# Patient Record
Sex: Female | Born: 1945 | Race: White | Hispanic: Refuse to answer | Smoking: Never smoker
Health system: Southern US, Community
[De-identification: ages and names within clinical notes are randomized; demographics above are authoritative.]

## PROBLEM LIST (undated history)

## (undated) DIAGNOSIS — B019 Varicella without complication: Secondary | ICD-10-CM

## (undated) DIAGNOSIS — F419 Anxiety disorder, unspecified: Secondary | ICD-10-CM

## (undated) DIAGNOSIS — C3490 Malignant neoplasm of unspecified part of unspecified bronchus or lung: Secondary | ICD-10-CM

## (undated) DIAGNOSIS — M810 Age-related osteoporosis without current pathological fracture: Secondary | ICD-10-CM

## (undated) DIAGNOSIS — E559 Vitamin D deficiency, unspecified: Secondary | ICD-10-CM

## (undated) HISTORY — PX: WISDOM TOOTH EXTRACTION: SHX21

## (undated) HISTORY — PX: EYE SURGERY: SHX253

## (undated) HISTORY — PX: BREAST EXCISIONAL BIOPSY: SUR124

## (undated) HISTORY — DX: Age-related osteoporosis without current pathological fracture: M81.0

## (undated) HISTORY — PX: COLONOSCOPY: SHX174

## (undated) HISTORY — DX: Varicella without complication: B01.9

## (undated) HISTORY — DX: Malignant neoplasm of unspecified part of unspecified bronchus or lung: C34.90

## (undated) HISTORY — PX: BREAST BIOPSY: SHX20

## (undated) HISTORY — DX: Vitamin D deficiency, unspecified: E55.9

## (undated) HISTORY — PX: TUBAL LIGATION: SHX77

---

## 2001-01-29 ENCOUNTER — Encounter: Payer: Self-pay | Admitting: Family Medicine

## 2001-01-29 ENCOUNTER — Encounter: Admission: RE | Admit: 2001-01-29 | Discharge: 2001-01-29 | Payer: Self-pay | Admitting: Family Medicine

## 2003-06-17 ENCOUNTER — Encounter: Admission: RE | Admit: 2003-06-17 | Discharge: 2003-06-17 | Payer: Self-pay | Admitting: Family Medicine

## 2003-06-17 ENCOUNTER — Encounter: Payer: Self-pay | Admitting: Family Medicine

## 2004-12-28 ENCOUNTER — Encounter (INDEPENDENT_AMBULATORY_CARE_PROVIDER_SITE_OTHER): Payer: Self-pay | Admitting: Specialist

## 2004-12-28 ENCOUNTER — Encounter: Admission: RE | Admit: 2004-12-28 | Discharge: 2004-12-28 | Payer: Self-pay | Admitting: General Surgery

## 2005-02-16 ENCOUNTER — Encounter: Admission: RE | Admit: 2005-02-16 | Discharge: 2005-02-16 | Payer: Self-pay | Admitting: General Surgery

## 2005-02-17 ENCOUNTER — Ambulatory Visit (HOSPITAL_COMMUNITY): Admission: RE | Admit: 2005-02-17 | Discharge: 2005-02-17 | Payer: Self-pay | Admitting: General Surgery

## 2005-02-17 ENCOUNTER — Encounter: Admission: RE | Admit: 2005-02-17 | Discharge: 2005-02-17 | Payer: Self-pay | Admitting: General Surgery

## 2005-02-17 ENCOUNTER — Ambulatory Visit (HOSPITAL_BASED_OUTPATIENT_CLINIC_OR_DEPARTMENT_OTHER): Admission: RE | Admit: 2005-02-17 | Discharge: 2005-02-17 | Payer: Self-pay | Admitting: General Surgery

## 2005-02-17 ENCOUNTER — Encounter (INDEPENDENT_AMBULATORY_CARE_PROVIDER_SITE_OTHER): Payer: Self-pay | Admitting: *Deleted

## 2006-01-16 ENCOUNTER — Encounter: Admission: RE | Admit: 2006-01-16 | Discharge: 2006-01-16 | Payer: Self-pay | Admitting: Obstetrics and Gynecology

## 2007-01-22 ENCOUNTER — Encounter: Admission: RE | Admit: 2007-01-22 | Discharge: 2007-01-22 | Payer: Self-pay | Admitting: Obstetrics and Gynecology

## 2007-03-13 ENCOUNTER — Encounter: Admission: RE | Admit: 2007-03-13 | Discharge: 2007-03-13 | Payer: Self-pay | Admitting: Family Medicine

## 2008-02-27 ENCOUNTER — Encounter: Admission: RE | Admit: 2008-02-27 | Discharge: 2008-02-27 | Payer: Self-pay | Admitting: Obstetrics and Gynecology

## 2009-06-04 ENCOUNTER — Encounter: Admission: RE | Admit: 2009-06-04 | Discharge: 2009-06-04 | Payer: Self-pay | Admitting: Obstetrics and Gynecology

## 2010-11-17 ENCOUNTER — Other Ambulatory Visit: Payer: Self-pay | Admitting: Obstetrics and Gynecology

## 2010-11-17 DIAGNOSIS — Z1231 Encounter for screening mammogram for malignant neoplasm of breast: Secondary | ICD-10-CM

## 2010-11-24 ENCOUNTER — Ambulatory Visit
Admission: RE | Admit: 2010-11-24 | Discharge: 2010-11-24 | Disposition: A | Discharge status: Home / Self Care | Payer: BC Managed Care – PPO | Source: Ambulatory Visit | Attending: Obstetrics and Gynecology | Admitting: Obstetrics and Gynecology

## 2010-11-24 DIAGNOSIS — Z1231 Encounter for screening mammogram for malignant neoplasm of breast: Secondary | ICD-10-CM

## 2011-01-28 NOTE — Op Note (Signed)
NAMELEIANN, SPORER                ACCOUNT NO.:  [id number]   MEDICAL RECORD NO.:  [id number]          PATIENT TYPE:  AMB   LOCATION:  DSC                          FACILITY:  [facility]   PHYSICIAN:  Ollen Gross. Vernell Morgans, M.D. 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   PROCEDURES:  Left breast needle localized lumpectomy.   SURGEON:  Ollen Gross. Carolynne Edouard, M.D.   ANESTHESIA:  General via LMA.   PROCEDURE:  After informed consent was obtained, the patient was brought to  the operating placed in supine position on the operating room table. After  adequate induction of general anesthesia, the patient had previously  undergone this morning a needle localization procedure.  The left breast was  prepped with Betadine and draped in usual sterile manner, taking care to  avoid any movement of the wire.  Next an elliptical incision was made around  the exit site of the wire and medial on the breast.  This incision was  carried down through the skin and subcutaneous tissue.  The course of the  wire was noted on x-ray and dissection was carried down towards the path  that the wire was taking.  A round concentric mass of breast tissue was  taken out around the path and tip of the wire. This was done by sharp  dissection with electrocautery. This was done so that the tip of the wire  was never visualized and the tissue around the wire was removed. The  specimen once removed was then oriented with a margin map and sent to  radiology where specimen x-ray was obtained that showed that all the  calcifications were within the specimen.  The specimen was then sent fresh  to pathology for further evaluation. The wound was then irrigated  copious  amounts of saline. Hemostasis was achieved using Bovie electrocautery. The  skin was then reapproximated with a running 4-0 Monocryl subcuticular  stitch. The wound was also infiltrated 4% Marcaine prior to finishing and  benzoin, Steri-Strips and sterile dressings were then applied. The patient  tolerated well.  At the end of the case, all needle, sponge and instrument  counts were correct. The patient was awakened and taken recovery in stable  condition.       PST/MEDQ  D:  02/17/2005  T:  02/17/2005  Job:  [id number]

## 2012-09-24 ENCOUNTER — Other Ambulatory Visit: Payer: Self-pay | Admitting: Family Medicine

## 2012-09-24 ENCOUNTER — Ambulatory Visit
Admission: RE | Admit: 2012-09-24 | Discharge: 2012-09-24 | Disposition: A | Discharge status: Home / Self Care | Payer: BC Managed Care – PPO | Source: Ambulatory Visit | Attending: Family Medicine | Admitting: Family Medicine

## 2012-09-24 DIAGNOSIS — J069 Acute upper respiratory infection, unspecified: Secondary | ICD-10-CM

## 2012-09-25 ENCOUNTER — Other Ambulatory Visit: Payer: Self-pay | Admitting: Unknown Physician Specialty

## 2012-09-25 ENCOUNTER — Ambulatory Visit
Admission: RE | Admit: 2012-09-25 | Discharge: 2012-09-25 | Disposition: A | Discharge status: Home / Self Care | Payer: BC Managed Care – PPO | Source: Ambulatory Visit | Attending: Unknown Physician Specialty | Admitting: Unknown Physician Specialty

## 2012-09-25 DIAGNOSIS — R9389 Abnormal findings on diagnostic imaging of other specified body structures: Secondary | ICD-10-CM

## 2013-01-08 ENCOUNTER — Other Ambulatory Visit: Payer: Self-pay | Admitting: Unknown Physician Specialty

## 2013-01-08 ENCOUNTER — Ambulatory Visit
Admission: RE | Admit: 2013-01-08 | Discharge: 2013-01-08 | Disposition: A | Discharge status: Home / Self Care | Payer: BC Managed Care – PPO | Source: Ambulatory Visit | Attending: Unknown Physician Specialty | Admitting: Unknown Physician Specialty

## 2013-01-08 DIAGNOSIS — R1011 Right upper quadrant pain: Secondary | ICD-10-CM

## 2013-09-12 DIAGNOSIS — T8859XA Other complications of anesthesia, initial encounter: Secondary | ICD-10-CM

## 2013-09-12 HISTORY — DX: Other complications of anesthesia, initial encounter: T88.59XA

## 2013-10-03 ENCOUNTER — Encounter: Payer: Self-pay | Admitting: Internal Medicine

## 2013-11-08 ENCOUNTER — Ambulatory Visit (AMBULATORY_SURGERY_CENTER): Payer: Self-pay

## 2013-11-08 ENCOUNTER — Encounter: Payer: Self-pay | Admitting: Internal Medicine

## 2013-11-08 VITALS — Ht 66.0 in | Wt 155.0 lb

## 2013-11-08 DIAGNOSIS — Z1211 Encounter for screening for malignant neoplasm of colon: Secondary | ICD-10-CM

## 2013-11-08 MED ORDER — SUPREP BOWEL PREP KIT 17.5-3.13-1.6 GM/177ML PO SOLN: 1.0000 | Freq: Once | ORAL | Status: DC

## 2013-11-22 ENCOUNTER — Encounter: Payer: Self-pay | Admitting: Internal Medicine

## 2013-11-22 ENCOUNTER — Ambulatory Visit (AMBULATORY_SURGERY_CENTER): Payer: BC Managed Care – PPO | Admitting: Internal Medicine

## 2013-11-22 VITALS — BP 124/77 | HR 59 | Temp 97.8°F | Resp 17 | Ht 66.0 in | Wt 155.0 lb

## 2013-11-22 DIAGNOSIS — Z1211 Encounter for screening for malignant neoplasm of colon: Secondary | ICD-10-CM

## 2013-11-22 DIAGNOSIS — K644 Residual hemorrhoidal skin tags: Secondary | ICD-10-CM

## 2013-11-22 MED ORDER — SODIUM CHLORIDE 0.9 % IV SOLN: 500.0000 mL | INTRAVENOUS | Status: DC

## 2013-11-22 NOTE — Op Note (Signed)
[facility]  [address] [place], [address]   COLONOSCOPY PROCEDURE REPORT  PATIENT: Taylor Bailey, Taylor Bailey  MR#: [id number] BIRTHDATE: 10-12-1945 , 51  yrs. old GENDER: Female ENDOSCOPIST: Gatha Mayer, MD, [facility] PROCEDURE DATE:  11/22/2013 PROCEDURE:   Colonoscopy, screening First Screening Colonoscopy - Avg.  risk and is 50 yrs.  old or older - No.  Prior Negative Screening - Now for repeat screening. 10 or more years since last screening  History of Adenoma - Now for follow-up colonoscopy & has been > or = to 3 yrs.  N/A  Polyps Removed Today? No.  Recommend repeat exam, <10 yrs? No. ASA CLASS:   Class I INDICATIONS:average risk screening and Last colonoscopy performed 2004. MEDICATIONS: propofol (Diprivan) 200mg  IV, MAC sedation, administered by CRNA, and These medications were titrated to patient response per physician's verbal order  DESCRIPTION OF PROCEDURE:   After the risks benefits and alternatives of the procedure were thoroughly explained, informed consent was obtained.  A digital rectal exam revealed no abnormalities of the rectum.   The LB [id number]  endoscope was introduced through the anus and advanced to the cecum, which was identified by both the appendix and ileocecal valve. No adverse events experienced.   The quality of the prep was excellent using Suprep  The instrument was then slowly withdrawn as the colon was fully examined.      COLON FINDINGS: A normal appearing cecum, ileocecal valve, and appendiceal orifice were identified.  The ascending, hepatic flexure, transverse, splenic flexure, descending, sigmoid colon and rectum appeared unremarkable.  No polyps or cancers were seen. Retroflexed views revealed external hemorrhoids. The time to cecum=6 minutes 36 seconds.  Withdrawal time=8 minutes 10 seconds. The scope was withdrawn and the procedure completed. COMPLICATIONS: There were no complications.  ENDOSCOPIC  IMPRESSION: Normal colon - small external hemorrhoids - excellent prep - second screening exam  RECOMMENDATIONS: Repeat Colonscopy in 10 years - 2025   eSigned:  Gatha Mayer, MD, [facility] 11/22/2013 9:09 AM   cc: The Patient

## 2013-11-22 NOTE — Progress Notes (Signed)
  [facility] Post-op Note  Patient: Taylor Bailey  Procedure(s) Performed: colonoscopy  Patient Location: [facility] - Recovery Area  Anesthesia Type: Deep Sedation/Propofol  Level of Consciousness: awake, oriented and patient cooperative  Airway and Oxygen Therapy: Patient Spontanous Breathing  Post-op Pain: none  Post-op Assessment:  Post-op Vital signs reviewed, Patient's Cardiovascular Status Stable, Respiratory Function Stable, Patent Airway, No signs of Nausea or vomiting and Pain level controlled  Post-op Vital Signs: Reviewed and stable  Complications: No apparent anesthesia complications  Finleigh Cheong E 9:09 AM

## 2013-11-22 NOTE — Patient Instructions (Addendum)
No polyps or cancer seen! Normal except for small external hemorrhoids (inside the anal canal).  Next routine colonoscopy in 10 years - 2025  I appreciate the opportunity to care for you. Gatha Mayer, MD, FACG  YOU HAD AN ENDOSCOPIC PROCEDURE TODAY AT [facility]: Refer to the procedure report that was given to you for any specific questions about what was found during the examination.  If the procedure report does not answer your questions, please call your gastroenterologist to clarify.  If you requested that your care partner not be given the details of your procedure findings, then the procedure report has been included in a sealed envelope for you to review at your convenience later.  YOU SHOULD EXPECT: Some feelings of bloating in the abdomen. Passage of more gas than usual.  Walking can help get rid of the air that was put into your GI tract during the procedure and reduce the bloating. If you had a lower endoscopy (such as a colonoscopy or flexible sigmoidoscopy) you may notice spotting of blood in your stool or on the toilet paper. If you underwent a bowel prep for your procedure, then you may not have a normal bowel movement for a few days.  DIET: Your first meal following the procedure should be a light meal and then it is ok to progress to your normal diet.  A half-sandwich or bowl of soup is an example of a good first meal.  Heavy or fried foods are harder to digest and may make you feel nauseous or bloated.  Likewise meals heavy in dairy and vegetables can cause extra gas to form and this can also increase the bloating.  Drink plenty of fluids but you should avoid alcoholic beverages for 24 hours.  ACTIVITY: Your care partner should take you home directly after the procedure.  You should plan to take it easy, moving slowly for the rest of the day.  You can resume normal activity the day after the procedure however you should NOT DRIVE or use heavy machinery for 24  hours (because of the sedation medicines used during the test).    SYMPTOMS TO REPORT IMMEDIATELY: A gastroenterologist can be reached at any hour.  During normal business hours, 8:30 AM to 5:00 PM Monday through Friday, call [phone].  After hours and on weekends, please call the GI answering service at [phone] who will take a message and have the physician on call contact you.   Following lower endoscopy (colonoscopy or flexible sigmoidoscopy):  Excessive amounts of blood in the stool  Significant tenderness or worsening of abdominal pains  Swelling of the abdomen that is new, acute  Fever of 100F or higher  FOLLOW UP: If any biopsies were taken you will be contacted by phone or by letter within the next 1-3 weeks.  Call your gastroenterologist if you have not heard about the biopsies in 3 weeks.  Our staff will call the home number listed on your records the next business day following your procedure to check on you and address any questions or concerns that you may have at that time regarding the information given to you following your procedure. This is a courtesy call and so if there is no answer at the home number and we have not heard from you through the emergency physician on call, we will assume that you have returned to your regular daily activities without incident.  SIGNATURES/CONFIDENTIALITY: You and/or your care partner have signed paperwork which  will be entered into your electronic medical record.  These signatures attest to the fact that that the information above on your After Visit Summary has been reviewed and is understood.  Full responsibility of the confidentiality of this discharge information lies with you and/or your care-partner.

## 2013-11-25 ENCOUNTER — Telehealth: Payer: Self-pay | Admitting: *Deleted

## 2013-11-25 NOTE — Telephone Encounter (Signed)
  Follow up Call-  Call back number 11/22/2013  Post procedure Call Back phone  # [phone]  Permission to leave phone message Yes     Patient questions:  Left message to call us if necessary.

## 2014-07-08 ENCOUNTER — Other Ambulatory Visit: Payer: Self-pay | Admitting: Obstetrics and Gynecology

## 2014-07-10 LAB — CYTOLOGY - PAP

## 2016-09-12 HISTORY — PX: CATARACT EXTRACTION, BILATERAL: SHX1313

## 2017-04-11 ENCOUNTER — Encounter: Payer: Self-pay | Admitting: Obstetrics & Gynecology

## 2017-04-11 ENCOUNTER — Ambulatory Visit (INDEPENDENT_AMBULATORY_CARE_PROVIDER_SITE_OTHER): Payer: BLUE CROSS/BLUE SHIELD | Admitting: Obstetrics & Gynecology

## 2017-04-11 ENCOUNTER — Ambulatory Visit (INDEPENDENT_AMBULATORY_CARE_PROVIDER_SITE_OTHER): Payer: Self-pay | Admitting: Obstetrics & Gynecology

## 2017-04-11 ENCOUNTER — Telehealth: Payer: Self-pay | Admitting: *Deleted

## 2017-04-11 VITALS — BP 142/90 | Ht 65.0 in | Wt 168.0 lb

## 2017-04-11 DIAGNOSIS — L292 Pruritus vulvae: Secondary | ICD-10-CM

## 2017-04-11 DIAGNOSIS — N760 Acute vaginitis: Secondary | ICD-10-CM

## 2017-04-11 DIAGNOSIS — K644 Residual hemorrhoidal skin tags: Secondary | ICD-10-CM

## 2017-04-11 DIAGNOSIS — B9689 Other specified bacterial agents as the cause of diseases classified elsewhere: Secondary | ICD-10-CM

## 2017-04-11 LAB — WET PREP FOR TRICH, YEAST, CLUE
Trich, Wet Prep: NONE SEEN
Yeast Wet Prep HPF POC: NONE SEEN

## 2017-04-11 MED ORDER — LIDOCAINE-HYDROCORTISONE ACE 3-1 % RE KIT: 1.0000 "application " | PACK | Freq: Two times a day (BID) | RECTAL | 2 refills | Status: DC

## 2017-04-11 MED ORDER — METRONIDAZOLE 0.75 % VA GEL: 1.0000 | Freq: Every day | VAGINAL | 0 refills | Status: AC

## 2017-04-11 NOTE — Telephone Encounter (Signed)
Pt was prescribed lidocaine HC 3-1% cream kit medication is not covered with insurance and over $400 with discount card per pharmacy. Can pt have another Rx?

## 2017-04-11 NOTE — Patient Instructions (Signed)
1. Vulvovaginal itching Possible Yeast Vaginitis initially, but treated with Monistat and no Yeast on Wet prep currently.  2. BV (bacterial vaginosis) BV positive on Wet prep.  Metronidazole Gel sent to pharmacy.  Usage reviewed.  Fluconazole 1 tab PO after Metronidazole as needed for prevention of Yeast.  3. Inflamed external hemorrhoid Recommend pushing hemorrhoid back in as much as possible.  Anamantle generic prescribed.  F/U Annual/Gyn exam at 1 year of last one.  Merilynn, it was a pleasure to meet you today!

## 2017-04-11 NOTE — Progress Notes (Signed)
    Taylor Bailey [birth year] [id number]        71 y.o.  G2P2 Widowed.  Son has 2 children.  Daughter single.  RP:  Vulvovaginal itching/burning post Cipro   HPI:  Used Monistat x 3 days, last dose last night, after finishing Cipro because of vulvar and vaginal burning with some itching.  No vaginal d/c seen.  No bleeding.  Burning especially at posterior aspect of vulva/perianally currently.  Has hemorrhoids since had her children.  No pelvic pain.  No UTI Sx currently.  BMs wnl.  Past medical history,surgical history, problem list, medications, allergies, family history and social history were all reviewed and documented in the EPIC chart.  Directed ROS with pertinent positives and negatives documented in the history of present illness/assessment and plan.  Exam:  Vitals:   04/11/17 1136  BP: (!) 142/90  Weight: 168 lb (76.2 kg)  Height: 5\' 5"  (1.651 m)   General appearance:  Normal  Gyn exam:  Vulva normal, no erythema, no lesion.                    Speculum:  Mild white d/c which could be Monistat cream.  Wet prep done.  Cervix/Vagina normal.                    Anal area with External very inflamed hemorrhoids.  Not thrombosed, not bleeding.  Hemorrhoids pushed back in.   Assessment/Plan:  71 y.o. G2P2   1. Vulvovaginal itching Possible Yeast Vaginitis initially, but treated with Monistat and no Yeast on Wet prep currently.  2. BV (bacterial vaginosis) BV positive on Wet prep.  Metronidazole Gel sent to pharmacy.  Usage reviewed.  Fluconazole 1 tab PO after Metronidazole as needed for prevention of Yeast.  3. Inflamed external hemorrhoid Recommend pushing hemorrhoid back in as much as possible.  Anamantle generic prescribed.  F/U Annual/Gyn exam at 1 year of last one.  Counseling on above issues >50% x 30 minutes.     Princess Bruins MD, 11:55 AM 04/11/2017

## 2017-04-13 NOTE — Telephone Encounter (Signed)
Prescription was for Anamantle generic for inflamed Hemorrhoids.  She can have any Hemorrhoid cream for Hemorrhoids, including simply Preparation H over-the-counter.

## 2017-04-13 NOTE — Telephone Encounter (Signed)
Pt informed, pt will use preparation H OTC states she is feeling much better since office visit.

## 2017-07-19 ENCOUNTER — Encounter: Payer: Self-pay | Admitting: Obstetrics & Gynecology

## 2017-07-19 ENCOUNTER — Ambulatory Visit (INDEPENDENT_AMBULATORY_CARE_PROVIDER_SITE_OTHER): Payer: BLUE CROSS/BLUE SHIELD | Admitting: Obstetrics & Gynecology

## 2017-07-19 VITALS — BP 128/84 | Ht 65.0 in | Wt 153.0 lb

## 2017-07-19 DIAGNOSIS — Z01419 Encounter for gynecological examination (general) (routine) without abnormal findings: Secondary | ICD-10-CM | POA: Diagnosis not present

## 2017-07-19 DIAGNOSIS — Z780 Asymptomatic menopausal state: Secondary | ICD-10-CM

## 2017-07-19 NOTE — Progress Notes (Signed)
Taylor Bailey 11-17-1945 [id number]   History:    71 y.o. G2P2  Widowed  RP:  Established patient presenting for annual gyn exam   HPI:  No problem with vulva since treatment for BV/Yeasts in 03/2017.  Menopause well-tolerated without hormone replacement therapy.  No postmenopausal bleeding.  No pelvic pain.  Breasts normal.  Urine and bowel movements normal.  Body mass index 25.46.  Past medical history,surgical history, family history and social history were all reviewed and documented in the EPIC chart.  Gynecologic History No LMP recorded. Patient is postmenopausal. Contraception: post menopausal status Last Pap: 2015. Results were: normal Last mammogram: 07/2016. Results were: normal Colono 2015 Dexa 2008  Obstetric History OB History  Gravida Para Term Preterm AB Living  2 2       2   SAB TAB Ectopic Multiple Live Births               # Outcome Date GA Lbr Len/2nd Weight Sex Delivery Anes PTL Lv  2 Para           1 Para                ROS: A ROS was performed and pertinent positives and negatives are included in the history.  GENERAL: No fevers or chills. HEENT: No change in vision, no earache, sore throat or sinus congestion. NECK: No pain or stiffness. CARDIOVASCULAR: No chest pain or pressure. No palpitations. PULMONARY: No shortness of breath, cough or wheeze. GASTROINTESTINAL: No abdominal pain, nausea, vomiting or diarrhea, melena or bright red blood per rectum. GENITOURINARY: No urinary frequency, urgency, hesitancy or dysuria. MUSCULOSKELETAL: No joint or muscle pain, no back pain, no recent trauma. DERMATOLOGIC: No rash, no itching, no lesions. ENDOCRINE: No polyuria, polydipsia, no heat or cold intolerance. No recent change in weight. HEMATOLOGICAL: No anemia or easy bruising or bleeding. NEUROLOGIC: No headache, seizures, numbness, tingling or weakness. PSYCHIATRIC: No depression, no loss of interest in normal activity or change in sleep pattern.      Exam:   BP 128/84   Ht 5\' 5"  (1.651 m)   Wt 153 lb (69.4 kg)   BMI 25.46 kg/m   Body mass index is 25.46 kg/m.  General appearance : Well developed well nourished female. No acute distress HEENT: Eyes: no retinal hemorrhage or exudates,  Neck supple, trachea midline, no carotid bruits, no thyroidmegaly Lungs: Clear to auscultation, no rhonchi or wheezes, or rib retractions  Heart: Regular rate and rhythm, no murmurs or gallops Breast:Examined in sitting and supine position were symmetrical in appearance, no palpable masses or tenderness,  no skin retraction, no nipple inversion, no nipple discharge, no skin discoloration, no axillary or supraclavicular lymphadenopathy Abdomen: no palpable masses or tenderness, no rebound or guarding Extremities: no edema or skin discoloration or tenderness  Pelvic: Vulva normal  Bartholin, Urethra, Skene Glands: Within normal limits             Vagina: No gross lesions or discharge  Cervix: No gross lesions or discharge.  Pap reflex done.  Uterus  AV, normal size, shape and consistency, non-tender and mobile  Adnexa  Without masses or tenderness  Anus and perineum  normal    Assessment/Plan:  71 y.o. female for annual exam   1. Encounter for routine gynecological examination with Papanicolaou smear of cervix Normal gynecologic exam.  Pap reflex done.  Breasts normal.  Will schedule screening mammogram.  Up-to-date with colonoscopy.  Fasting labs with family doctor.  2.  Menopause present Menopause well-tolerated without hormone replacement therapy.  No postmenopausal bleeding.  Vitamin D supplements.  Calcium rich diet.  Weightbearing physical activity recommended.  Will follow up with a bone density. - DG Bone Density; Future   Princess Bruins MD, 3:07 PM 07/19/2017

## 2017-07-20 ENCOUNTER — Other Ambulatory Visit: Payer: Self-pay | Admitting: Obstetrics & Gynecology

## 2017-07-20 DIAGNOSIS — Z1231 Encounter for screening mammogram for malignant neoplasm of breast: Secondary | ICD-10-CM

## 2017-07-21 LAB — PAP IG W/ RFLX HPV ASCU

## 2017-07-23 ENCOUNTER — Encounter: Payer: Self-pay | Admitting: Obstetrics & Gynecology

## 2017-07-23 NOTE — Patient Instructions (Signed)
1. Encounter for routine gynecological examination with Papanicolaou smear of cervix   2. Menopause present Menopause well-tolerated without hormone replacement therapy.  No postmenopausal bleeding.  Vitamin D supplements.  Calcium rich diet.  Weightbearing physical activity recommended.  Will follow up with a bone density. - [facility] Bone Density; Future  Taylor Bailey, it was a pleasure seeing you today.  I will inform you of your results as soon as available.   Health Maintenance for Postmenopausal Women Menopause is a normal process in which your reproductive ability comes to an end. This process happens gradually over a span of months to years, usually between the ages of 66 and 42. Menopause is complete when you have missed 12 consecutive menstrual periods. It is important to talk with your health care provider about some of the most common conditions that affect postmenopausal women, such as heart disease, cancer, and bone loss (osteoporosis). Adopting a healthy lifestyle and getting preventive care can help to promote your health and wellness. Those actions can also lower your chances of developing some of these common conditions. What should I know about menopause? During menopause, you may experience a number of symptoms, such as:  Moderate-to-severe hot flashes.  Night sweats.  Decrease in sex drive.  Mood swings.  Headaches.  Tiredness.  Irritability.  Memory problems.  Insomnia.  Choosing to treat or not to treat menopausal changes is an individual decision that you make with your health care provider. What should I know about hormone replacement therapy and supplements? Hormone therapy products are effective for treating symptoms that are associated with menopause, such as hot flashes and night sweats. Hormone replacement carries certain risks, especially as you become older. If you are thinking about using estrogen or estrogen with progestin treatments, discuss the benefits and  risks with your health care provider. What should I know about heart disease and stroke? Heart disease, heart attack, and stroke become more likely as you age. This may be due, in part, to the hormonal changes that your body experiences during menopause. These can affect how your body processes dietary fats, triglycerides, and cholesterol. Heart attack and stroke are both medical emergencies. There are many things that you can do to help prevent heart disease and stroke:  Have your blood pressure checked at least every 1-2 years. High blood pressure causes heart disease and increases the risk of stroke.  If you are 92-9 years old, ask your health care provider if you should take aspirin to prevent a heart attack or a stroke.  Do not use any tobacco products, including cigarettes, chewing tobacco, or electronic cigarettes. If you need help quitting, ask your health care provider.  It is important to eat a healthy diet and maintain a healthy weight. ? Be sure to include plenty of vegetables, fruits, low-fat dairy products, and lean protein. ? Avoid eating foods that are high in solid fats, added sugars, or salt (sodium).  Get regular exercise. This is one of the most important things that you can do for your health. ? Try to exercise for at least 150 minutes each week. The type of exercise that you do should increase your heart rate and make you sweat. This is known as moderate-intensity exercise. ? Try to do strengthening exercises at least twice each week. Do these in addition to the moderate-intensity exercise.  Know your numbers.Ask your health care provider to check your cholesterol and your blood glucose. Continue to have your blood tested as directed by your health care provider.  What should I know about cancer screening? There are several types of cancer. Take the following steps to reduce your risk and to catch any cancer development as early as possible. Breast Cancer  Practice  breast self-awareness. ? This means understanding how your breasts normally appear and feel. ? It also means doing regular breast self-exams. Let your health care provider know about any changes, no matter how small.  If you are 57 or older, have a clinician do a breast exam (clinical breast exam or CBE) every year. Depending on your age, family history, and medical history, it may be recommended that you also have a yearly breast X-ray (mammogram).  If you have a family history of breast cancer, talk with your health care provider about genetic screening.  If you are at high risk for breast cancer, talk with your health care provider about having an MRI and a mammogram every year.  Breast cancer (BRCA) gene test is recommended for women who have family members with BRCA-related cancers. Results of the assessment will determine the need for genetic counseling and BRCA1 and for BRCA2 testing. BRCA-related cancers include these types: ? Breast. This occurs in males or females. ? Ovarian. ? Tubal. This may also be called fallopian tube cancer. ? Cancer of the abdominal or pelvic lining (peritoneal cancer). ? Prostate. ? Pancreatic.  Cervical, Uterine, and Ovarian Cancer Your health care provider may recommend that you be screened regularly for cancer of the pelvic organs. These include your ovaries, uterus, and vagina. This screening involves a pelvic exam, which includes checking for microscopic changes to the surface of your cervix (Pap test).  For women ages 21-65, health care providers may recommend a pelvic exam and a Pap test every three years. For women ages 54-65, they may recommend the Pap test and pelvic exam, combined with testing for human papilloma virus (HPV), every five years. Some types of HPV increase your risk of cervical cancer. Testing for HPV may also be done on women of any age who have unclear Pap test results.  Other health care providers may not recommend any screening  for nonpregnant women who are considered low risk for pelvic cancer and have no symptoms. Ask your health care provider if a screening pelvic exam is right for you.  If you have had past treatment for cervical cancer or a condition that could lead to cancer, you need Pap tests and screening for cancer for at least 20 years after your treatment. If Pap tests have been discontinued for you, your risk factors (such as having a new sexual partner) need to be reassessed to determine if you should start having screenings again. Some women have medical problems that increase the chance of getting cervical cancer. In these cases, your health care provider may recommend that you have screening and Pap tests more often.  If you have a family history of uterine cancer or ovarian cancer, talk with your health care provider about genetic screening.  If you have vaginal bleeding after reaching menopause, tell your health care provider.  There are currently no reliable tests available to screen for ovarian cancer.  Lung Cancer Lung cancer screening is recommended for adults 13-76 years old who are at high risk for lung cancer because of a history of smoking. A yearly low-dose CT scan of the lungs is recommended if you:  Currently smoke.  Have a history of at least 30 pack-years of smoking and you currently smoke or have quit within the past  15 years. A pack-year is smoking an average of one pack of cigarettes per day for one year.  Yearly screening should:  Continue until it has been 15 years since you quit.  Stop if you develop a health problem that would prevent you from having lung cancer treatment.  Colorectal Cancer  This type of cancer can be detected and can often be prevented.  Routine colorectal cancer screening usually begins at age 38 and continues through age 34.  If you have risk factors for colon cancer, your health care provider may recommend that you be screened at an earlier age.  If  you have a family history of colorectal cancer, talk with your health care provider about genetic screening.  Your health care provider may also recommend using home test kits to check for hidden blood in your stool.  A small camera at the end of a tube can be used to examine your colon directly (sigmoidoscopy or colonoscopy). This is done to check for the earliest forms of colorectal cancer.  Direct examination of the colon should be repeated every 5-10 years until age 59. However, if early forms of precancerous polyps or small growths are found or if you have a family history or genetic risk for colorectal cancer, you may need to be screened more often.  Skin Cancer  Check your skin from head to toe regularly.  Monitor any moles. Be sure to tell your health care provider: ? About any new moles or changes in moles, especially if there is a change in a mole's shape or color. ? If you have a mole that is larger than the size of a pencil eraser.  If any of your family members has a history of skin cancer, especially at a young age, talk with your health care provider about genetic screening.  Always use sunscreen. Apply sunscreen liberally and repeatedly throughout the day.  Whenever you are outside, protect yourself by wearing long sleeves, pants, a wide-brimmed hat, and sunglasses.  What should I know about osteoporosis? Osteoporosis is a condition in which bone destruction happens more quickly than new bone creation. After menopause, you may be at an increased risk for osteoporosis. To help prevent osteoporosis or the bone fractures that can happen because of osteoporosis, the following is recommended:  If you are 30-34 years old, get at least 1,000 mg of calcium and at least 600 mg of vitamin D per day.  If you are older than age 2 but younger than age 13, get at least 1,200 mg of calcium and at least 600 mg of vitamin D per day.  If you are older than age 74, get at least 1,200 mg of  calcium and at least 800 mg of vitamin D per day.  Smoking and excessive alcohol intake increase the risk of osteoporosis. Eat foods that are rich in calcium and vitamin D, and do weight-bearing exercises several times each week as directed by your health care provider. What should I know about how menopause affects my mental health? Depression may occur at any age, but it is more common as you become older. Common symptoms of depression include:  Low or sad mood.  Changes in sleep patterns.  Changes in appetite or eating patterns.  Feeling an overall lack of motivation or enjoyment of activities that you previously enjoyed.  Frequent crying spells.  Talk with your health care provider if you think that you are experiencing depression. What should I know about immunizations? It is important  that you get and maintain your immunizations. These include:  Tetanus, diphtheria, and pertussis (Tdap) booster vaccine.  Influenza every year before the flu season begins.  Pneumonia vaccine.  Shingles vaccine.  Your health care provider may also recommend other immunizations. This information is not intended to replace advice given to you by your health care provider. Make sure you discuss any questions you have with your health care provider. Document Released: 10/21/2005 Document Revised: 03/18/2016 Document Reviewed: 06/02/2015 Elsevier Interactive Patient Education  2018 [employer]  

## 2017-08-15 ENCOUNTER — Ambulatory Visit
Admission: RE | Admit: 2017-08-15 | Discharge: 2017-08-15 | Disposition: A | Discharge status: Home / Self Care | Payer: BLUE CROSS/BLUE SHIELD | Source: Ambulatory Visit | Attending: Obstetrics & Gynecology | Admitting: Obstetrics & Gynecology

## 2017-08-15 DIAGNOSIS — Z1231 Encounter for screening mammogram for malignant neoplasm of breast: Secondary | ICD-10-CM

## 2017-10-10 ENCOUNTER — Other Ambulatory Visit: Payer: Self-pay | Admitting: Gynecology

## 2017-10-10 DIAGNOSIS — Z780 Asymptomatic menopausal state: Secondary | ICD-10-CM

## 2017-10-13 DIAGNOSIS — M810 Age-related osteoporosis without current pathological fracture: Secondary | ICD-10-CM

## 2017-10-13 HISTORY — DX: Age-related osteoporosis without current pathological fracture: M81.0

## 2017-10-24 ENCOUNTER — Other Ambulatory Visit: Payer: Self-pay | Admitting: Gynecology

## 2017-10-24 ENCOUNTER — Ambulatory Visit (INDEPENDENT_AMBULATORY_CARE_PROVIDER_SITE_OTHER): Payer: BLUE CROSS/BLUE SHIELD

## 2017-10-24 DIAGNOSIS — Z780 Asymptomatic menopausal state: Secondary | ICD-10-CM

## 2017-10-24 DIAGNOSIS — M810 Age-related osteoporosis without current pathological fracture: Secondary | ICD-10-CM | POA: Diagnosis not present

## 2017-10-25 ENCOUNTER — Encounter: Payer: Self-pay | Admitting: Gynecology

## 2017-10-25 ENCOUNTER — Telehealth: Payer: Self-pay | Admitting: Gynecology

## 2017-10-25 NOTE — Telephone Encounter (Signed)
Tell patient her most recent bone density shows osteoporosis.  She needs to make an appointment with Dr Dellis Filbert to discuss treatment options.  She is at an increased risk of fracture

## 2017-10-26 NOTE — Telephone Encounter (Signed)
Left message for pt to call.

## 2017-10-26 NOTE — Telephone Encounter (Signed)
Pt informed

## 2018-05-29 ENCOUNTER — Other Ambulatory Visit: Payer: Self-pay | Admitting: Obstetrics & Gynecology

## 2018-05-29 DIAGNOSIS — Z1231 Encounter for screening mammogram for malignant neoplasm of breast: Secondary | ICD-10-CM

## 2018-07-24 ENCOUNTER — Ambulatory Visit: Payer: BLUE CROSS/BLUE SHIELD | Admitting: Obstetrics & Gynecology

## 2018-07-24 ENCOUNTER — Encounter: Payer: Self-pay | Admitting: Obstetrics & Gynecology

## 2018-07-24 VITALS — BP 140/78 | Ht 65.0 in | Wt 157.0 lb

## 2018-07-24 DIAGNOSIS — M810 Age-related osteoporosis without current pathological fracture: Secondary | ICD-10-CM | POA: Diagnosis not present

## 2018-07-24 DIAGNOSIS — Z780 Asymptomatic menopausal state: Secondary | ICD-10-CM | POA: Diagnosis not present

## 2018-07-24 DIAGNOSIS — Z01419 Encounter for gynecological examination (general) (routine) without abnormal findings: Secondary | ICD-10-CM | POA: Diagnosis not present

## 2018-07-24 NOTE — Progress Notes (Signed)
Taylor Bailey [birth year] [id number]   History:    72 y.o. G2P2L2 Widowed.  Working for [employer].  RP:  Established patient presenting for annual gyn exam   HPI: Very fit and active.  Menopause, well on no HRT.  No PMB.  No pelvic pain.  Abstinent.  Urine and bowel movements normal.  Breasts normal.  Body mass index 26.13.  Fitness through regular walking and gym.  Excellent nutrition.  Health labs with family physician.  Colonoscopy normal in 2015.  Past medical history,surgical history, family history and social history were all reviewed and documented in the EPIC chart.  Gynecologic History No LMP recorded. Patient is postmenopausal. Contraception: abstinence and post menopausal status Last Pap: 07/2017. Results were: Negative Last mammogram: 08/2017. Results were: Negative.  [facility] Bone Density: 10/2017 Osteoporosis T-Score -2.9.  Decision not to start on medical treatment, but rather maximize Vit D, Ca++ 1.5 g/day and Weightbearing activities Colonoscopy: 2015 normal, 10 yr schedule  Obstetric History OB History  Gravida Para Term Preterm AB Living  2 2       2   SAB TAB Ectopic Multiple Live Births               # Outcome Date GA Lbr Len/2nd Weight Sex Delivery Anes PTL Lv  2 Para           1 Para              ROS: A ROS was performed and pertinent positives and negatives are included in the history.  GENERAL: No fevers or chills. HEENT: No change in vision, no earache, sore throat or sinus congestion. NECK: No pain or stiffness. CARDIOVASCULAR: No chest pain or pressure. No palpitations. PULMONARY: No shortness of breath, cough or wheeze. GASTROINTESTINAL: No abdominal pain, nausea, vomiting or diarrhea, melena or bright red blood per rectum. GENITOURINARY: No urinary frequency, urgency, hesitancy or dysuria. MUSCULOSKELETAL: No joint or muscle pain, no back pain, no recent trauma. DERMATOLOGIC: No rash, no itching, no lesions. ENDOCRINE: No  polyuria, polydipsia, no heat or cold intolerance. No recent change in weight. HEMATOLOGICAL: No anemia or easy bruising or bleeding. NEUROLOGIC: No headache, seizures, numbness, tingling or weakness. PSYCHIATRIC: No depression, no loss of interest in normal activity or change in sleep pattern.     Exam:   BP 140/78   Ht 5\' 5"  (1.651 m)   Wt 157 lb (71.2 kg)   BMI 26.13 kg/m   Body mass index is 26.13 kg/m.  General appearance : Well developed well nourished female. No acute distress HEENT: Eyes: no retinal hemorrhage or exudates,  Neck supple, trachea midline, no carotid bruits, no thyroidmegaly Lungs: Clear to auscultation, no rhonchi or wheezes, or rib retractions  Heart: Regular rate and rhythm, no murmurs or gallops Breast:Examined in sitting and supine position were symmetrical in appearance, no palpable masses or tenderness,  no skin retraction, no nipple inversion, no nipple discharge, no skin discoloration, no axillary or supraclavicular lymphadenopathy Abdomen: no palpable masses or tenderness, no rebound or guarding Extremities: no edema or skin discoloration or tenderness  Pelvic: Vulva: Normal             Vagina: No gross lesions or discharge  Cervix: No gross lesions or discharge  Uterus  AV, normal size, shape and consistency, non-tender and mobile  Adnexa  Without masses or tenderness  Anus: Normal   Assessment/Plan:  72 y.o. female for annual exam   1. Well female exam with  routine gynecological exam Normal gynecologic exam and menopause.  Pap test in November 2018 was negative.  No indication to repeat a Pap test this year.  Breast exam normal.  Screening mammogram is scheduled at the breast center December 2019.  Last colonoscopy was normal in 2015.  Health labs with family physician.  Body mass index 26.13.  Fit and healthy nutrition.  2. Postmenopausal Well on no hormone replacement therapy.  No postmenopausal bleeding.  3. Age-related osteoporosis without  current pathological fracture Osteoporosis on bone density February 2019.  T score -2.9.  After counseling, patient decided not to start on medical treatment for osteoporosis.  She will optimize her vitamin D supplements, calcium intake of 1.5 g a day and continue with regular weightbearing physical activities.  Her bone density will be repeated in February 2021.  Princess Bruins MD, 8:26 AM 07/24/2018

## 2018-07-29 ENCOUNTER — Encounter: Payer: Self-pay | Admitting: Obstetrics & Gynecology

## 2018-07-29 NOTE — Patient Instructions (Signed)
  1. Well female exam with routine gynecological exam Normal gynecologic exam and menopause.  Pap test in November 2018 was negative.  No indication to repeat a Pap test this year.  Breast exam normal.  Screening mammogram is scheduled at the breast center December 2019.  Last colonoscopy was normal in 2015.  Health labs with family physician.  Body mass index 26.13.  Fit and healthy nutrition.  2. Postmenopausal Well on no hormone replacement therapy.  No postmenopausal bleeding.  3. Age-related osteoporosis without current pathological fracture Osteoporosis on bone density February 2019.  T score -2.9.  After counseling, patient decided not to start on medical treatment for osteoporosis.  She will optimize her vitamin D supplements, calcium intake of 1.5 g a day and continue with regular weightbearing physical activities.  Her bone density will be repeated in February 2021.  Taylor Bailey, it was a pleasure seeing you today!

## 2018-08-16 ENCOUNTER — Ambulatory Visit
Admission: RE | Admit: 2018-08-16 | Discharge: 2018-08-16 | Disposition: A | Discharge status: Home / Self Care | Payer: BLUE CROSS/BLUE SHIELD | Source: Ambulatory Visit | Attending: Obstetrics & Gynecology | Admitting: Obstetrics & Gynecology

## 2018-08-16 DIAGNOSIS — Z1231 Encounter for screening mammogram for malignant neoplasm of breast: Secondary | ICD-10-CM

## 2018-09-12 DIAGNOSIS — C801 Malignant (primary) neoplasm, unspecified: Secondary | ICD-10-CM

## 2018-09-12 HISTORY — DX: Malignant (primary) neoplasm, unspecified: C80.1

## 2019-04-02 ENCOUNTER — Encounter: Payer: Self-pay | Admitting: Family Medicine

## 2019-04-02 ENCOUNTER — Ambulatory Visit (INDEPENDENT_AMBULATORY_CARE_PROVIDER_SITE_OTHER): Payer: BC Managed Care – PPO | Admitting: Family Medicine

## 2019-04-02 ENCOUNTER — Other Ambulatory Visit: Payer: Self-pay

## 2019-04-02 VITALS — BP 120/80 | HR 65 | Temp 98.2°F | Ht 64.5 in | Wt 160.2 lb

## 2019-04-02 DIAGNOSIS — E559 Vitamin D deficiency, unspecified: Secondary | ICD-10-CM | POA: Diagnosis not present

## 2019-04-02 DIAGNOSIS — E785 Hyperlipidemia, unspecified: Secondary | ICD-10-CM | POA: Diagnosis not present

## 2019-04-02 DIAGNOSIS — Z0000 Encounter for general adult medical examination without abnormal findings: Secondary | ICD-10-CM | POA: Diagnosis not present

## 2019-04-02 DIAGNOSIS — M810 Age-related osteoporosis without current pathological fracture: Secondary | ICD-10-CM

## 2019-04-02 NOTE — Assessment & Plan Note (Signed)
Osteoporosis and was on fosamax 6 years- unfortunately had significant bone loss in jaw (osteonecrosis). Started treatment in 43s. 10/2017  Last dexa T score -2.9 in lumbar spine with gynecology. - Wonder about possible Prolia treatment but without recent bisphosphonate treatment not sure how helpful it would be.

## 2019-04-02 NOTE — Patient Instructions (Addendum)
Health Maintenance Due  Topic Date Due  . TETANUS/TDAP Please send the updated date for this vaccine 12/01/1964  . PNA vac Low Risk Adult (1 of 2 - PCV13)- declines 12/02/2011  . Shingrix once covid in a better position  Please stop by lab before you go If you do not have mychart- we will call you about results within 5 business days of [place] receiving them.  If you have mychart- we will send your results within 3 business days of [place] receiving them.  If abnormal or we want to clarify a result, we will call or mychart you to make sure you receive the message.  If you have questions or concerns or don't hear within 5-7 days, please send [place] a message or call us.

## 2019-04-02 NOTE — Progress Notes (Signed)
Phone: [phone]   Subjective:  Patient presents today to establish care.  Prior patient of Dr. Linna Darner.  Chief Complaint  Patient presents with  . Establish Care    See problem oriented charting  The following were reviewed and entered/updated in epic: Past Medical History:  Diagnosis Date  . Chicken pox   . Osteoporosis 10/2017   T score -2.9  . Vitamin D deficiency    takes 2000 units each day   Patient Active Problem List   Diagnosis Date Noted  . Hyperlipidemia, unspecified 04/02/2019  . Vitamin D deficiency   . Osteoporosis 10/2017   Past Surgical History:  Procedure Laterality Date  . BREAST BIOPSY Left    BENIGN  . BREAST EXCISIONAL BIOPSY Left   . CATARACT EXTRACTION, BILATERAL  2018  . COLONOSCOPY    . WISDOM TOOTH EXTRACTION      Family History  Problem Relation Age of Onset  . Breast cancer Mother        lived to 21. relatives have lived in 71s and 66s  . Diabetes Maternal Grandmother   . Other Father        black lung- lived to 33  . Healthy Sister   . Colon cancer Neg Hx   . Pancreatic cancer Neg Hx   . Stomach cancer Neg Hx     Medications- reviewed and updated Current Outpatient Medications  Medication Sig Dispense Refill  . amoxicillin-clavulanate (AUGMENTIN) 875-125 MG tablet Take 1 tablet by mouth 2 (two) times daily.    . cholecalciferol (VITAMIN D) 1000 UNITS tablet Take 1,000 Units by mouth daily.    Marland Kitchen ipratropium (ATROVENT) 0.06 % nasal spray 2 SPRAYS EACH NOSTRIL FOUR TIMES A DAY    . levocetirizine (XYZAL) 5 MG tablet Take 5 mg by mouth daily.    . mupirocin ointment (BACTROBAN) 2 % APPLY TO THE AFFECTED AREA TWICE A DAY FOR 3 5 DAYS     No current facility-administered medications for this visit.     Allergies-reviewed and updated Allergies  Allergen Reactions  . Fluoride Preparations     FLU VACCINE  . Influenza Vaccines     Hives, angioedema- monitored in office on benadryl (received at [facility])   .  Prednisone     Bad shakes/hyper after 2 days. Not sure about injections     Social History   Social History Narrative   Widowed in 2013- multiple medical conditions. 2 children- son (boy 39, daughter 55) and daughter in 2020.       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  HENT: Negative for ear discharge and nosebleeds.   Eyes: Negative for pain and discharge.  Respiratory: Negative for cough and shortness of breath.   Cardiovascular: Negative for chest pain and leg swelling.  Gastrointestinal: Negative for abdominal pain, diarrhea, nausea and vomiting.  Genitourinary: Negative for dysuria and urgency.  Musculoskeletal: Negative for falls and neck pain.  Skin: Negative for itching and rash.  Neurological: Negative for speech change and focal weakness.  Endo/Heme/Allergies: Negative for polydipsia. Does not bruise/bleed easily.  Psychiatric/Behavioral: Negative for hallucinations and substance abuse.  Objective  Objective:  BP 120/80   Pulse 65   Temp 98.2 F (36.8 C) (Oral)   Ht 5' 4.5" (1.638 m)   Wt 160 lb 3.2 oz (72.7 kg)   SpO2 99%   BMI 27.07 kg/m  Gen: NAD, resting comfortably HEENT: Mucous membranes are moist. Oropharynx normal. TM normal. Eyes: sclera and lids normal, PERRLA Neck: no thyromegaly, no cervical lymphadenopathy CV: RRR no murmurs rubs or gallops Lungs: CTAB no crackles, wheeze, rhonchi Abdomen: soft/nontender/nondistended/normal bowel sounds. No rebound or guarding.  Ext: no edema Skin: warm, dry Neuro: 5/5 strength in upper and lower extremities, normal gait, normal reflexes    Assessment and  Plan:   73 y.o. female presenting for annual physical.  Health Maintenance counseling: 1. Anticipatory guidance: Patient counseled regarding regular dental exams -q6 months, eye exams -,  avoiding smoking and second hand smoke , limiting alcohol to 1 beverage per day- rare social .   2. Risk factor reduction:  Advised patient of need for regular exercise and diet rich and fruits and vegetables to reduce risk of heart attack and stroke. Exercise- steps 2.5 to 4 miles daily most days- remains active. Diet-weight up slightly with covid . Usually good on avoiding fried foods. Usually pretty healthy diet- just wants to cut back on sweets.  Wt Readings from Last 3 Encounters:  04/02/19 160 lb 3.2 oz (72.7 kg)  07/24/18 157 lb (71.2 kg)  07/19/17 153 lb (69.4 kg)  3. Immunizations/screenings/ancillary studies- Shingrix deferred today with covid 19. Declines pneumovax. Hep C screening with [facility] already. Had Tdap 04/27/16- just need to get records.   4. Cervical cancer screening- still sees gyn. Last pap 07/2017 just to get baseline- never had abnormal 5. Breast cancer screening-  breast exam with gyn and mammogram 08/2018 with yearly repeat 6. Colon cancer screening - colonoscopy 11/22/13 with 10 year repeat with no polyps or family history 7. Skin cancer screening- sees dermatology every year or so. advised regular sunscreen use. Denies worrisome, changing, or new skin lesions.  8. Birth control/STD check- postmenopausal and not active 9. Osteoporosis screening at 63-  Osteoporosis and was on fosamax 6 years- unfortunately had significant bone loss in jaw (osteonecrosis). Started treatment in 35s. 10/2017  Last dexa  -Never smoker  Status of chronic or acute concerns   Mild hyperlipdemia on labs from [facility]-we will update lipids non fasting today   Osteoporosis  Osteoporosis and was on fosamax 6 years- unfortunately had significant bone loss in jaw (osteonecrosis). Started treatment in 6s.  10/2017  Last dexa T score -2.9 in lumbar spine with gynecology. - Wonder about possible Prolia treatment but without recent bisphosphonate treatment not sure how helpful it would be.   Recommended follow up: 1 year physical is reasonable Future Appointments  Date Time Provider [place]  09/02/2019 10:30 AM Princess Bruins, MD [facility]   Lab/Order associations: oatmeal breakfast, banana, veggies for lunch with chicken and milk this AM and some pasta salad. Would prefer to get fasting labs   ICD-10-CM   1. Preventative health care  Z00.00 CBC    Comprehensive metabolic panel    Lipid panel    VITAMIN D 25 Hydroxy (Vit-D Deficiency, Fractures)  2. Hyperlipidemia, unspecified hyperlipidemia type  E78.5 CBC    Comprehensive metabolic panel    Lipid panel  3. Vitamin D deficiency  E55.9 VITAMIN D 25 Hydroxy (Vit-D Deficiency, Fractures)  4. Age-related osteoporosis without current pathological fracture  M81.0    Return precautions  advised.  Garret Reddish, MD

## 2019-04-03 LAB — LIPID PANEL
Cholesterol: 191 mg/dL (ref 0–200)
HDL: 60.5 mg/dL (ref >39.00)
LDL Cholesterol: 110 mg/dL — ABNORMAL HIGH (ref 0–99)
NonHDL: 130.88
Total CHOL/HDL Ratio: 3
Triglycerides: 105 mg/dL (ref 0.0–149.0)
VLDL: 21 mg/dL (ref 0.0–40.0)

## 2019-04-03 LAB — COMPREHENSIVE METABOLIC PANEL
ALT: 13 U/L (ref 0–35)
AST: 17 U/L (ref 0–37)
Albumin: 4.2 g/dL (ref 3.5–5.2)
Alkaline Phosphatase: 67 U/L (ref 39–117)
BUN: 14 mg/dL (ref 6–23)
CO2: 28 mEq/L (ref 19–32)
Calcium: 9.5 mg/dL (ref 8.4–10.5)
Chloride: 103 mEq/L (ref 96–112)
Creatinine, Ser: 0.64 mg/dL (ref 0.40–1.20)
GFR: 91.13 mL/min (ref >60.00)
Glucose, Bld: 99 mg/dL (ref 70–99)
Potassium: 3.8 mEq/L (ref 3.5–5.1)
Sodium: 139 mEq/L (ref 135–145)
Total Bilirubin: 0.4 mg/dL (ref 0.2–1.2)
Total Protein: 6.9 g/dL (ref 6.0–8.3)

## 2019-04-03 LAB — CBC
HCT: 36.5 % (ref 36.0–46.0)
Hemoglobin: 12.1 g/dL (ref 12.0–15.0)
MCHC: 33.1 g/dL (ref 30.0–36.0)
MCV: 84.1 fl (ref 78.0–100.0)
Platelets: 248 10*3/uL (ref 150.0–400.0)
RBC: 4.34 Mil/uL (ref 3.87–5.11)
RDW: 14.7 % (ref 11.5–15.5)
WBC: 7.2 10*3/uL (ref 4.0–10.5)

## 2019-04-03 LAB — VITAMIN D 25 HYDROXY (VIT D DEFICIENCY, FRACTURES): VITD: 51.5 ng/mL (ref 30.00–100.00)

## 2019-05-21 ENCOUNTER — Other Ambulatory Visit: Payer: Self-pay | Admitting: Obstetrics & Gynecology

## 2019-05-21 DIAGNOSIS — Z1231 Encounter for screening mammogram for malignant neoplasm of breast: Secondary | ICD-10-CM

## 2019-06-19 ENCOUNTER — Encounter: Payer: Self-pay | Admitting: Gynecology

## 2019-07-25 ENCOUNTER — Other Ambulatory Visit: Payer: Self-pay

## 2019-07-26 ENCOUNTER — Encounter: Payer: Self-pay | Admitting: Obstetrics & Gynecology

## 2019-07-26 ENCOUNTER — Ambulatory Visit (INDEPENDENT_AMBULATORY_CARE_PROVIDER_SITE_OTHER): Payer: BC Managed Care – PPO | Admitting: Obstetrics & Gynecology

## 2019-07-26 VITALS — BP 140/80 | Ht 64.5 in | Wt 154.0 lb

## 2019-07-26 DIAGNOSIS — Z01419 Encounter for gynecological examination (general) (routine) without abnormal findings: Secondary | ICD-10-CM | POA: Diagnosis not present

## 2019-07-26 DIAGNOSIS — Z780 Asymptomatic menopausal state: Secondary | ICD-10-CM | POA: Diagnosis not present

## 2019-07-26 DIAGNOSIS — M810 Age-related osteoporosis without current pathological fracture: Secondary | ICD-10-CM

## 2019-07-26 NOTE — Progress Notes (Signed)
Taylor Bailey [birth year] [id number]   History:    73 y.o. G2P2L2 Widowed. Working on [employer] for [employer]  RP:  Established patient presenting for annual gyn exam   HPI: Very fit and active.  Menopause, well on no HRT.  No PMB.  No pelvic pain.  Abstinent.  Urine and bowel movements normal.  Breasts normal.  Body mass index 26.03.  Fitness through regular walking.  Excellent nutrition.  Increased anxiety, doing meditation. Health labs with family physician.  Colonoscopy normal in 2015.  Past medical history,surgical history, family history and social history were all reviewed and documented in the EPIC chart.  Gynecologic History No LMP recorded. Patient is postmenopausal. Contraception: abstinence and post menopausal status Last Pap: 07/2017. Results were: Negative Last mammogram: 08/2018. Results were: Negative Bone Density: 10/2017 Osteoporosis, declined treatment.  Repeat BD 11/2019. Colonoscopy: 2015  Obstetric History OB History  Gravida Para Term Preterm AB Living  2 2       2   SAB TAB Ectopic Multiple Live Births               # Outcome Date GA Lbr Len/2nd Weight Sex Delivery Anes PTL Lv  2 Para           1 Para              ROS: A ROS was performed and pertinent positives and negatives are included in the history.  GENERAL: No fevers or chills. HEENT: No change in vision, no earache, sore throat or sinus congestion. NECK: No pain or stiffness. CARDIOVASCULAR: No chest pain or pressure. No palpitations. PULMONARY: No shortness of breath, cough or wheeze. GASTROINTESTINAL: No abdominal pain, nausea, vomiting or diarrhea, melena or bright red blood per rectum. GENITOURINARY: No urinary frequency, urgency, hesitancy or dysuria. MUSCULOSKELETAL: No joint or muscle pain, no back pain, no recent trauma. DERMATOLOGIC: No rash, no itching, no lesions. ENDOCRINE: No polyuria, polydipsia, no heat or cold intolerance. No recent change in weight. HEMATOLOGICAL: No anemia or easy  bruising or bleeding. NEUROLOGIC: No headache, seizures, numbness, tingling or weakness. PSYCHIATRIC: No depression, no loss of interest in normal activity or change in sleep pattern.     Exam:   BP  140/80    Weight  154lb(69.9kg)   Height  5'4.5"(1.65m)   Other Vitals  BMI 26.03 kg/m2  BSA 1.78 m2      General appearance : Well developed well nourished female. No acute distress HEENT: Eyes: no retinal hemorrhage or exudates,  Neck supple, trachea midline, no carotid bruits, no thyroidmegaly Lungs: Clear to auscultation, no rhonchi or wheezes, or rib retractions  Heart: Regular rate and rhythm, no murmurs or gallops Breast:Examined in sitting and supine position were symmetrical in appearance, no palpable masses or tenderness,  no skin retraction, no nipple inversion, no nipple discharge, no skin discoloration, no axillary or supraclavicular lymphadenopathy Abdomen: no palpable masses or tenderness, no rebound or guarding Extremities: no edema or skin discoloration or tenderness  Pelvic: Vulva: Normal             Vagina: No gross lesions or discharge  Cervix: No gross lesions or discharge  Uterus  AV, normal size, shape and consistency, non-tender and mobile  Adnexa  Without masses or tenderness  Anus: Normal   Assessment/Plan:  73 y.o. female for annual exam   1. Well female exam with routine gynecological exam Normal gynecologic exam in menopause.  Pap test was negative in November 2018, no indication to repeat  this year.  Breast exam normal.  Last screening mammogram December 2019 was negative.  Colonoscopy 2015.  Health labs with family physician.  Good body mass index at 26.03.  Continue with fitness and healthy nutrition.  2. Postmenopausal Well on no hormone replacement therapy.  No postmenopausal bleeding.  3. Age-related osteoporosis without current pathological fracture Osteoporosis with a T score of -2.9 on last bone density February 2019.  Patient  decided not to start on any medical treatment at that time after counseling.  Patient is taking vitamin D supplements, good calcium intake through nutrition and regular weightbearing physical activities.  We will repeat a bone density in March 2021. - DG Bone Density; Future  Princess Bruins MD, 9:19 AM 07/26/2019

## 2019-07-26 NOTE — Patient Instructions (Signed)
1. Well female exam with routine gynecological exam Normal gynecologic exam in menopause.  Pap test was negative in November 2018, no indication to repeat this year.  Breast exam normal.  Last screening mammogram December 2019 was negative.  Colonoscopy 2015.  Health labs with family physician.  Good body mass index at 26.03.  Continue with fitness and healthy nutrition.  2. Postmenopausal Well on no hormone replacement therapy.  No postmenopausal bleeding.  3. Age-related osteoporosis without current pathological fracture Osteoporosis with a T score of -2.9 on last bone density February 2019.  Patient decided not to start on any medical treatment at that time after counseling.  Patient is taking vitamin D supplements, good calcium intake through nutrition and regular weightbearing physical activities.  We will repeat a bone density in March 2021. - DG Bone Density; Future  Taylor Bailey, it was a pleasure seeing you today!

## 2019-08-20 ENCOUNTER — Ambulatory Visit
Admission: RE | Admit: 2019-08-20 | Discharge: 2019-08-20 | Disposition: A | Discharge status: Home / Self Care | Payer: BC Managed Care – PPO | Source: Ambulatory Visit | Attending: Obstetrics & Gynecology | Admitting: Obstetrics & Gynecology

## 2019-08-20 ENCOUNTER — Other Ambulatory Visit: Payer: Self-pay

## 2019-08-20 DIAGNOSIS — Z1231 Encounter for screening mammogram for malignant neoplasm of breast: Secondary | ICD-10-CM

## 2019-09-02 ENCOUNTER — Encounter: Payer: BLUE CROSS/BLUE SHIELD | Admitting: Obstetrics & Gynecology

## 2019-11-19 ENCOUNTER — Other Ambulatory Visit: Payer: Self-pay | Admitting: Family Medicine

## 2019-11-19 DIAGNOSIS — M810 Age-related osteoporosis without current pathological fracture: Secondary | ICD-10-CM

## 2019-11-19 DIAGNOSIS — M1991 Primary osteoarthritis, unspecified site: Secondary | ICD-10-CM

## 2019-11-19 DIAGNOSIS — M545 Low back pain, unspecified: Secondary | ICD-10-CM

## 2020-04-07 ENCOUNTER — Other Ambulatory Visit: Payer: Self-pay | Admitting: Obstetrics & Gynecology

## 2020-04-07 DIAGNOSIS — Z1231 Encounter for screening mammogram for malignant neoplasm of breast: Secondary | ICD-10-CM

## 2020-06-29 ENCOUNTER — Other Ambulatory Visit: Payer: Self-pay | Admitting: Family Medicine

## 2020-06-30 ENCOUNTER — Other Ambulatory Visit: Payer: Self-pay | Admitting: Family Medicine

## 2020-07-15 ENCOUNTER — Other Ambulatory Visit: Payer: Self-pay | Admitting: Family Medicine

## 2020-07-15 DIAGNOSIS — R42 Dizziness and giddiness: Secondary | ICD-10-CM

## 2020-07-15 DIAGNOSIS — I517 Cardiomegaly: Secondary | ICD-10-CM

## 2020-07-30 ENCOUNTER — Telehealth (HOSPITAL_COMMUNITY): Payer: Self-pay | Admitting: Emergency Medicine

## 2020-07-30 NOTE — Telephone Encounter (Signed)
Error

## 2020-07-31 ENCOUNTER — Ambulatory Visit (HOSPITAL_COMMUNITY)
Admission: RE | Admit: 2020-07-31 | Discharge: 2020-07-31 | Disposition: A | Discharge status: Home / Self Care | Payer: Self-pay | Source: Ambulatory Visit | Attending: Family Medicine | Admitting: Family Medicine

## 2020-07-31 ENCOUNTER — Ambulatory Visit (HOSPITAL_COMMUNITY)
Admission: RE | Admit: 2020-07-31 | Discharge: 2020-07-31 | Disposition: A | Discharge status: Home / Self Care | Payer: BC Managed Care – PPO | Source: Ambulatory Visit | Attending: Family Medicine | Admitting: Family Medicine

## 2020-07-31 ENCOUNTER — Other Ambulatory Visit: Payer: Self-pay

## 2020-07-31 ENCOUNTER — Ambulatory Visit (HOSPITAL_BASED_OUTPATIENT_CLINIC_OR_DEPARTMENT_OTHER)
Admission: RE | Admit: 2020-07-31 | Discharge: 2020-07-31 | Disposition: A | Discharge status: Home / Self Care | Payer: BC Managed Care – PPO | Source: Ambulatory Visit | Attending: Family Medicine | Admitting: Family Medicine

## 2020-07-31 DIAGNOSIS — R42 Dizziness and giddiness: Secondary | ICD-10-CM

## 2020-07-31 DIAGNOSIS — C801 Malignant (primary) neoplasm, unspecified: Secondary | ICD-10-CM | POA: Diagnosis not present

## 2020-07-31 DIAGNOSIS — I517 Cardiomegaly: Secondary | ICD-10-CM | POA: Insufficient documentation

## 2020-07-31 LAB — ECHOCARDIOGRAM COMPLETE: Area-P 1/2: 4.41 cm2

## 2020-07-31 NOTE — Progress Notes (Signed)
  Echocardiogram 2D Echocardiogram has been performed.  Jannett Celestine 07/31/2020, 9:36 AM

## 2020-08-04 ENCOUNTER — Other Ambulatory Visit: Payer: Self-pay | Admitting: Family Medicine

## 2020-08-04 DIAGNOSIS — R911 Solitary pulmonary nodule: Secondary | ICD-10-CM

## 2020-08-04 DIAGNOSIS — R059 Cough, unspecified: Secondary | ICD-10-CM

## 2020-08-19 ENCOUNTER — Other Ambulatory Visit: Payer: Self-pay

## 2020-08-19 ENCOUNTER — Ambulatory Visit (HOSPITAL_COMMUNITY)
Admission: RE | Admit: 2020-08-19 | Discharge: 2020-08-19 | Disposition: A | Discharge status: Home / Self Care | Payer: BC Managed Care – PPO | Source: Ambulatory Visit | Attending: Family Medicine | Admitting: Family Medicine

## 2020-08-19 DIAGNOSIS — R911 Solitary pulmonary nodule: Secondary | ICD-10-CM | POA: Insufficient documentation

## 2020-08-19 DIAGNOSIS — R059 Cough, unspecified: Secondary | ICD-10-CM | POA: Insufficient documentation

## 2020-08-19 LAB — POCT I-STAT CREATININE: Creatinine, Ser: 0.7 mg/dL (ref 0.44–1.00)

## 2020-08-19 MED ORDER — IOHEXOL 300 MG/ML  SOLN
75.0000 mL | Freq: Once | INTRAMUSCULAR | Status: AC | PRN
  Administered 2020-08-19: 75 mL via INTRAVENOUS

## 2020-08-21 ENCOUNTER — Other Ambulatory Visit: Payer: Self-pay | Admitting: Internal Medicine

## 2020-08-21 DIAGNOSIS — R918 Other nonspecific abnormal finding of lung field: Secondary | ICD-10-CM

## 2020-08-24 ENCOUNTER — Ambulatory Visit: Payer: BC Managed Care – PPO

## 2020-08-24 ENCOUNTER — Other Ambulatory Visit: Payer: Self-pay

## 2020-08-24 ENCOUNTER — Encounter: Payer: Self-pay | Admitting: Pulmonary Disease

## 2020-08-24 ENCOUNTER — Ambulatory Visit: Payer: BC Managed Care – PPO | Admitting: Pulmonary Disease

## 2020-08-24 VITALS — BP 140/74 | HR 79 | Temp 97.6°F | Ht 66.0 in | Wt 157.0 lb

## 2020-08-24 DIAGNOSIS — Z7722 Contact with and (suspected) exposure to environmental tobacco smoke (acute) (chronic): Secondary | ICD-10-CM

## 2020-08-24 DIAGNOSIS — R599 Enlarged lymph nodes, unspecified: Secondary | ICD-10-CM | POA: Diagnosis not present

## 2020-08-24 DIAGNOSIS — R918 Other nonspecific abnormal finding of lung field: Secondary | ICD-10-CM

## 2020-08-24 DIAGNOSIS — Z789 Other specified health status: Secondary | ICD-10-CM | POA: Diagnosis not present

## 2020-08-24 NOTE — Patient Instructions (Signed)
Thank you for visiting Dr. Valeta Harms at [facility]. Today we recommend the following:  Orders Placed This Encounter  Procedures   Procedural/ Surgical Case Request: VIDEO BRONCHOSCOPY WITH ENDOBRONCHIAL ULTRASOUND, [place]   Ambulatory referral to Pulmonology   Bronchoscopy to be scheduled this Friday. You will need to come to hospital at 530AM. Nothing to eat past midnight the night before.  Return in about 6 weeks (around 10/05/2020) for Dr. Valeta Harms .    Please do your part to reduce the spread of COVID-19.

## 2020-08-24 NOTE — H&P (View-Only) (Signed)
Synopsis: Referred in December 2021 for abnormal CT imaging by [name], MD  Subjective:   PATIENT ID: Taylor Bailey GENDER: female DOB: 03/05/1946, MRN: [id number]  Chief Complaint  Patient presents with  . Consult    Lung mass, dry cough with PND     This is a 74 year old female, past medical history of osteoporosis, vitamin D deficiency, mother with breast cancer.  She is a documented never smoker.  Patient had cardiac scoring CT in November 2021 which revealed normal coronaries however there was a 3.4 x 3.3 cm mass within the left upper lobe and hilum.  Patient had a CT scan of the chest in December 2021 on 08/19/2020 which revealed a 2.7 x 2.3 left hilar mass.  With associated postobstructive atelectasis and concern for endobronchial lesion.  Patient was referred for urgent evaluation.  Patient has no significant complaints.  Denies hemoptysis.  Denies weight loss.   Past Medical History:  Diagnosis Date  . 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 Chicken pox   . Osteoporosis 10/2017   T score -2.9  . Vitamin D deficiency    takes 2000 units each day     Family History  Problem Relation Age of Onset  . Breast cancer Mother        lived to 27. relatives have lived in 25s and 62s  . Diabetes Maternal Grandmother   . Other Father        black lung- lived to 33  . Healthy Sister   . Colon cancer Neg Hx   . Pancreatic cancer Neg Hx   . Stomach cancer Neg Hx      Past Surgical History:  Procedure Laterality Date  . BREAST BIOPSY Left    BENIGN  . BREAST EXCISIONAL BIOPSY Left   . CATARACT EXTRACTION, BILATERAL  2018  . COLONOSCOPY    . WISDOM TOOTH EXTRACTION      Social History   Socioeconomic History  . Marital status: Widowed    Spouse name: Not on file  . Number of children: Not on file  . Years of education: Not on file  . Highest education level: Not on file  Occupational History  . Not on file  Tobacco  Use  . Smoking status: Never Smoker  . Smokeless tobacco: Never Used  Vaping Use  . Vaping Use: Never used  Substance and Sexual Activity  . Alcohol use: Yes    Alcohol/week: 1.0 standard drink    Types: 1 Glasses of wine per week    Comment: occ glass of wine  . Drug use: No  . Sexual activity: Never    Comment: 1st intercourse- 22, partners- 1   Other Topics Concern  . Not on file  Social History Narrative   Widowed in 2013- multiple medical conditions. 2 children- son (boy 4, daughter 34) and daughter in 2020.       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 Fluoride Preparations     FLU VACCINE  . Influenza Vaccines     Hives, angioedema- monitored in office on benadryl (received at [facility])   . Prednisone     Bad shakes/hyper after 2 days. Not sure about injections      Outpatient Medications Prior to Visit  Medication Sig Dispense Refill  . cholecalciferol (VITAMIN D) 1000 UNITS tablet Take 1,000 Units by mouth daily.     No facility-administered medications prior to visit.    Review of Systems  Constitutional: Negative for chills, fever, malaise/fatigue and weight loss.  HENT: Negative for hearing loss, sore throat and tinnitus.   Eyes: Negative for blurred vision and double vision.  Respiratory: Negative for cough, hemoptysis, sputum production, shortness of breath, wheezing and stridor.   Cardiovascular:  Negative for chest pain, palpitations, orthopnea, leg swelling and PND.  Gastrointestinal: Negative for abdominal pain, constipation, diarrhea, heartburn, nausea and vomiting.  Genitourinary: Negative for dysuria, hematuria and urgency.  Musculoskeletal: Negative for joint pain and myalgias.  Skin: Negative for itching and rash.  Neurological: Negative for dizziness, tingling, weakness and headaches.  Endo/Heme/Allergies: Negative for environmental allergies. Does not bruise/bleed easily.  Psychiatric/Behavioral: Negative for depression. The patient is not nervous/anxious and does not have insomnia.   All other systems reviewed and are negative.    Objective:  Physical Exam Vitals reviewed.  Constitutional:      General: She is not in acute distress.    Appearance: She is well-developed and well-nourished.  HENT:     Head: Normocephalic and atraumatic.     Mouth/Throat:     Mouth: Oropharynx is clear and moist.  Eyes:     General: No scleral icterus.    Conjunctiva/sclera: Conjunctivae normal.     Pupils: Pupils are equal, round, and reactive to light.  Neck:     Vascular: No JVD.     Trachea: No tracheal deviation.  Cardiovascular:     Rate and Rhythm: Normal rate and regular rhythm.     Pulses: Intact distal pulses.     Heart sounds: Normal heart sounds. No murmur heard.   Pulmonary:     Effort: Pulmonary effort is normal. No tachypnea, accessory muscle usage or respiratory distress.     Breath sounds: Normal breath sounds. No stridor. No wheezing, rhonchi or rales.  Abdominal:     General: Bowel sounds are normal. There is no distension.     Palpations: Abdomen is soft.     Tenderness: There is no abdominal tenderness.  Musculoskeletal:        General: No tenderness or edema.     Cervical back: Neck supple.  Lymphadenopathy:     Cervical: No cervical adenopathy.  Skin:    General: Skin is warm and dry.     Capillary Refill: Capillary refill takes less than 2  seconds.     Findings: No rash.  Neurological:     Mental Status: She is alert and oriented to person, place, and time.  Psychiatric:        Mood and Affect: Mood and affect normal.        Behavior: Behavior normal.      Vitals:   08/24/20 1536  BP: 140/74  Pulse: 79  Temp: 97.6 F (36.4 C)  TempSrc: Tympanic  SpO2: 97%  Weight: 157 lb (71.2 kg)  Height: 5\' 6"  (1.676 m)  97% on RA BMI Readings from Last 3 Encounters:  08/24/20 25.34 kg/m  07/26/19 26.03 kg/m  04/02/19 27.07 kg/m   Wt Readings from Last 3 Encounters:  08/24/20 157 lb (71.2 kg)  07/26/19 154 lb (69.9 kg)  04/02/19 160 lb 3.2 oz (72.7 kg)     CBC    Component Value Date/Time   WBC 7.2 04/02/2019 1507   RBC 4.34 04/02/2019 1507   HGB 12.1 04/02/2019 1507   HCT 36.5 04/02/2019 1507   PLT 248.0 04/02/2019 1507   MCV 84.1 04/02/2019 1507   MCHC 33.1 04/02/2019 1507   RDW 14.7 04/02/2019 1507     Chest Imaging: CT scan of the chest 08/19/2020: Revealed a left hilar mass proximately 2.7 x 2.3 cm concerning for adenopathy or metastatic disease.  There is a large area of consolidation within the left upper lobe suprahilar region with postobstructive atelectasis possible endobronchial lesion present. The patient's images have been independently reviewed by me.    Pulmonary Functions Testing Results: No flowsheet data found.  FeNO:   Pathology:   Echocardiogram:   Heart Catheterization:     Assessment & Plan:     ICD-10-CM   1. Lung mass  R91.8 Ambulatory referral to Pulmonology    Procedural/ Surgical Case Request: VIDEO BRONCHOSCOPY WITH ENDOBRONCHIAL ULTRASOUND, VIDEO BRONCHOSCOPY WITH ENDOBRONCHIAL NAVIGATION  2. Adenopathy  R59.9   3. Non-smoker  Z78.9   4. Second hand smoke exposure  Z77.22     Discussion:  This is a 74 year old female no real significant past medical history.  Non-smoker however secondhand smoke exposure for 82 years of marriage to her husband.  No family  history of lung cancer.  Patient presents with a abnormal CT findings to include a large left hilar mass/adenopathy and concern for a upper lobe lesion with the lung.  Plan: Today we discussed the risk benefits alternatives of proceeding with video bronchoscopy with endobronchial ultrasound transbronchial needle aspirations possible navigation if there is no visible endobronchial lesion or unable to visualize with EBUS as this is in a upper lobe position.  We discussed the risk of bleeding as well as possible for pneumothorax.  Patient is agreeable to proceed with bronchoscopy as soon as possible.  We will plan for this on Friday morning of this week.  Patient remain n.p.o. at midnight the night before.  She is not on any blood thinners or antiplatelets need to be held.   Current Outpatient Medications:  .  cholecalciferol (VITAMIN D) 1000 UNITS tablet, Take 1,000 Units by mouth daily., Disp: , Rfl:   I spent  62 minutes dedicated to the care of this patient on the date of this encounter to include pre-visit review of records, face-to-face time with the patient discussing conditions above, post visit ordering of testing, clinical documentation with the electronic health record, making appropriate referrals as documented, and communicating necessary findings to members of the patients care team.   Garner Nash, [place] [facility] 08/24/2020 6:02 PM

## 2020-08-24 NOTE — Progress Notes (Signed)
Synopsis: Referred in December 2021 for abnormal CT imaging by [name], MD  Subjective:   PATIENT ID: Taylor Bailey GENDER: female DOB: Mar 27, 1946, MRN: [id number]  Chief Complaint  Patient presents with  . Consult    Lung mass, dry cough with PND     This is a 74 year old female, past medical history of osteoporosis, vitamin D deficiency, mother with breast cancer.  She is a documented never smoker.  Patient had cardiac scoring CT in November 2021 which revealed normal coronaries however there was a 3.4 x 3.3 cm mass within the left upper lobe and hilum.  Patient had a CT scan of the chest in December 2021 on 08/19/2020 which revealed a 2.7 x 2.3 left hilar mass.  With associated postobstructive atelectasis and concern for endobronchial lesion.  Patient was referred for urgent evaluation.  Patient has no significant complaints.  Denies hemoptysis.  Denies weight loss.   Past Medical History:  Diagnosis Date  . 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 Chicken pox   . Osteoporosis 10/2017   T score -2.9  . Vitamin D deficiency    takes 2000 units each day     Family History  Problem Relation Age of Onset  . Breast cancer Mother        lived to 58. relatives have lived in 57s and 81s  . Diabetes Maternal Grandmother   . Other Father        black lung- lived to 47  . Healthy Sister   . Colon cancer Neg Hx   . Pancreatic cancer Neg Hx   . Stomach cancer Neg Hx      Past Surgical History:  Procedure Laterality Date  . BREAST BIOPSY Left    BENIGN  . BREAST EXCISIONAL BIOPSY Left   . CATARACT EXTRACTION, BILATERAL  2018  . COLONOSCOPY    . WISDOM TOOTH EXTRACTION      Social History   Socioeconomic History  . Marital status: Widowed    Spouse name: Not on file  . Number of children: Not on file  . Years of education: Not on file  . Highest education level: Not on file  Occupational History  . Not on file  Tobacco  Use  . Smoking status: Never Smoker  . Smokeless tobacco: Never Used  Vaping Use  . Vaping Use: Never used  Substance and Sexual Activity  . Alcohol use: Yes    Alcohol/week: 1.0 standard drink    Types: 1 Glasses of wine per week    Comment: occ glass of wine  . Drug use: No  . Sexual activity: Never    Comment: 1st intercourse- 22, partners- 1   Other Topics Concern  . Not on file  Social History Narrative   Widowed in 2013- multiple medical conditions. 2 children- son (boy 64, daughter 36) and daughter in 2020.       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 Fluoride Preparations     FLU VACCINE  . Influenza Vaccines     Hives, angioedema- monitored in office on benadryl (received at [facility])   . Prednisone     Bad shakes/hyper after 2 days. Not sure about injections      Outpatient Medications Prior to Visit  Medication Sig Dispense Refill  . cholecalciferol (VITAMIN D) 1000 UNITS tablet Take 1,000 Units by mouth daily.     No facility-administered medications prior to visit.    Review of Systems  Constitutional: Negative for chills, fever, malaise/fatigue and weight loss.  HENT: Negative for hearing loss, sore throat and tinnitus.   Eyes: Negative for blurred vision and double vision.  Respiratory: Negative for cough, hemoptysis, sputum production, shortness of breath, wheezing and stridor.   Cardiovascular:  Negative for chest pain, palpitations, orthopnea, leg swelling and PND.  Gastrointestinal: Negative for abdominal pain, constipation, diarrhea, heartburn, nausea and vomiting.  Genitourinary: Negative for dysuria, hematuria and urgency.  Musculoskeletal: Negative for joint pain and myalgias.  Skin: Negative for itching and rash.  Neurological: Negative for dizziness, tingling, weakness and headaches.  Endo/Heme/Allergies: Negative for environmental allergies. Does not bruise/bleed easily.  Psychiatric/Behavioral: Negative for depression. The patient is not nervous/anxious and does not have insomnia.   All other systems reviewed and are negative.    Objective:  Physical Exam Vitals reviewed.  Constitutional:      General: She is not in acute distress.    Appearance: She is well-developed and well-nourished.  HENT:     Head: Normocephalic and atraumatic.     Mouth/Throat:     Mouth: Oropharynx is clear and moist.  Eyes:     General: No scleral icterus.    Conjunctiva/sclera: Conjunctivae normal.     Pupils: Pupils are equal, round, and reactive to light.  Neck:     Vascular: No JVD.     Trachea: No tracheal deviation.  Cardiovascular:     Rate and Rhythm: Normal rate and regular rhythm.     Pulses: Intact distal pulses.     Heart sounds: Normal heart sounds. No murmur heard.   Pulmonary:     Effort: Pulmonary effort is normal. No tachypnea, accessory muscle usage or respiratory distress.     Breath sounds: Normal breath sounds. No stridor. No wheezing, rhonchi or rales.  Abdominal:     General: Bowel sounds are normal. There is no distension.     Palpations: Abdomen is soft.     Tenderness: There is no abdominal tenderness.  Musculoskeletal:        General: No tenderness or edema.     Cervical back: Neck supple.  Lymphadenopathy:     Cervical: No cervical adenopathy.  Skin:    General: Skin is warm and dry.     Capillary Refill: Capillary refill takes less than 2  seconds.     Findings: No rash.  Neurological:     Mental Status: She is alert and oriented to person, place, and time.  Psychiatric:        Mood and Affect: Mood and affect normal.        Behavior: Behavior normal.      Vitals:   08/24/20 1536  BP: 140/74  Pulse: 79  Temp: 97.6 F (36.4 C)  TempSrc: Tympanic  SpO2: 97%  Weight: 157 lb (71.2 kg)  Height: 5\' 6"  (1.676 m)  97% on RA BMI Readings from Last 3 Encounters:  08/24/20 25.34 kg/m  07/26/19 26.03 kg/m  04/02/19 27.07 kg/m   Wt Readings from Last 3 Encounters:  08/24/20 157 lb (71.2 kg)  07/26/19 154 lb (69.9 kg)  04/02/19 160 lb 3.2 oz (72.7 kg)     CBC    Component Value Date/Time   WBC 7.2 04/02/2019 1507   RBC 4.34 04/02/2019 1507   HGB 12.1 04/02/2019 1507   HCT 36.5 04/02/2019 1507   PLT 248.0 04/02/2019 1507   MCV 84.1 04/02/2019 1507   MCHC 33.1 04/02/2019 1507   RDW 14.7 04/02/2019 1507     Chest Imaging: CT scan of the chest 08/19/2020: Revealed a left hilar mass proximately 2.7 x 2.3 cm concerning for adenopathy or metastatic disease.  There is a large area of consolidation within the left upper lobe suprahilar region with postobstructive atelectasis possible endobronchial lesion present. The patient's images have been independently reviewed by me.    Pulmonary Functions Testing Results: No flowsheet data found.  FeNO:   Pathology:   Echocardiogram:   Heart Catheterization:     Assessment & Plan:     ICD-10-CM   1. Lung mass  R91.8 Ambulatory referral to Pulmonology    Procedural/ Surgical Case Request: VIDEO BRONCHOSCOPY WITH ENDOBRONCHIAL ULTRASOUND, VIDEO BRONCHOSCOPY WITH ENDOBRONCHIAL NAVIGATION  2. Adenopathy  R59.9   3. Non-smoker  Z78.9   4. Second hand smoke exposure  Z77.22     Discussion:  This is a 74 year old female no real significant past medical history.  Non-smoker however secondhand smoke exposure for 28 years of marriage to her husband.  No family  history of lung cancer.  Patient presents with a abnormal CT findings to include a large left hilar mass/adenopathy and concern for a upper lobe lesion with the lung.  Plan: Today we discussed the risk benefits alternatives of proceeding with video bronchoscopy with endobronchial ultrasound transbronchial needle aspirations possible navigation if there is no visible endobronchial lesion or unable to visualize with EBUS as this is in a upper lobe position.  We discussed the risk of bleeding as well as possible for pneumothorax.  Patient is agreeable to proceed with bronchoscopy as soon as possible.  We will plan for this on Friday morning of this week.  Patient remain n.p.o. at midnight the night before.  She is not on any blood thinners or antiplatelets need to be held.   Current Outpatient Medications:  .  cholecalciferol (VITAMIN D) 1000 UNITS tablet, Take 1,000 Units by mouth daily., Disp: , Rfl:   I spent  62 minutes dedicated to the care of this patient on the date of this encounter to include pre-visit review of records, face-to-face time with the patient discussing conditions above, post visit ordering of testing, clinical documentation with the electronic health record, making appropriate referrals as documented, and communicating necessary findings to members of the patients care team.   Garner Nash, [facility] 08/24/2020 6:02 PM

## 2020-08-25 ENCOUNTER — Telehealth: Payer: Self-pay | Admitting: Medical Oncology

## 2020-08-25 ENCOUNTER — Other Ambulatory Visit (HOSPITAL_COMMUNITY)
Admission: RE | Admit: 2020-08-25 | Discharge: 2020-08-25 | Disposition: A | Discharge status: Home / Self Care | Payer: BC Managed Care – PPO | Source: Ambulatory Visit | Attending: Pulmonary Disease | Admitting: Pulmonary Disease

## 2020-08-25 DIAGNOSIS — R918 Other nonspecific abnormal finding of lung field: Secondary | ICD-10-CM | POA: Diagnosis present

## 2020-08-25 DIAGNOSIS — Z01812 Encounter for preprocedural laboratory examination: Secondary | ICD-10-CM | POA: Insufficient documentation

## 2020-08-25 DIAGNOSIS — Z20822 Contact with and (suspected) exposure to covid-19: Secondary | ICD-10-CM | POA: Insufficient documentation

## 2020-08-25 DIAGNOSIS — Z85828 Personal history of other malignant neoplasm of skin: Secondary | ICD-10-CM | POA: Diagnosis not present

## 2020-08-25 DIAGNOSIS — Z7722 Contact with and (suspected) exposure to environmental tobacco smoke (acute) (chronic): Secondary | ICD-10-CM | POA: Diagnosis not present

## 2020-08-25 DIAGNOSIS — E559 Vitamin D deficiency, unspecified: Secondary | ICD-10-CM | POA: Diagnosis not present

## 2020-08-25 DIAGNOSIS — R599 Enlarged lymph nodes, unspecified: Secondary | ICD-10-CM | POA: Diagnosis not present

## 2020-08-25 LAB — SARS CORONAVIRUS 2 (TAT 6-24 HRS): SARS Coronavirus 2: NEGATIVE

## 2020-08-25 NOTE — Telephone Encounter (Signed)
Asking to schedule PET scan. I told her to call central scheduling and if needed to contact Dr Morley Kos office for obtaining authorization.

## 2020-08-26 ENCOUNTER — Encounter: Payer: Self-pay | Admitting: *Deleted

## 2020-08-26 ENCOUNTER — Telehealth: Payer: Self-pay | Admitting: Pulmonary Disease

## 2020-08-26 DIAGNOSIS — R918 Other nonspecific abnormal finding of lung field: Secondary | ICD-10-CM

## 2020-08-26 NOTE — Progress Notes (Signed)
I received a message that patient needs to be seen by Dr. Julien Nordmann. I updated new patient coordinator to call and schedule her to be seen on 12/28 with Dr. Julien Nordmann.

## 2020-08-26 NOTE — Telephone Encounter (Signed)
Checked insurance no pa req and notified Flo Shanks

## 2020-08-27 ENCOUNTER — Encounter (HOSPITAL_COMMUNITY): Payer: Self-pay | Admitting: Pulmonary Disease

## 2020-08-27 ENCOUNTER — Other Ambulatory Visit: Payer: Self-pay

## 2020-08-27 ENCOUNTER — Telehealth: Payer: Self-pay | Admitting: Internal Medicine

## 2020-08-27 NOTE — Telephone Encounter (Signed)
Received a new pt referral from Dr. Valeta Harms for lung cancer. Taylor Bailey has been cld and scheduled to see Dr. Julien Nordmann on 12/28 at 215 w/labs at 145pm. Pt aware to arrive 15 minutes early.

## 2020-08-27 NOTE — Progress Notes (Signed)
Taylor Bailey denies chest pain or shortness of breath. Patient tested negative for Covid 08/25/20 and has been in quarantine since that time.

## 2020-08-28 ENCOUNTER — Other Ambulatory Visit: Payer: Self-pay

## 2020-08-28 ENCOUNTER — Ambulatory Visit (HOSPITAL_COMMUNITY): Payer: BC Managed Care – PPO | Admitting: Certified Registered"

## 2020-08-28 ENCOUNTER — Encounter (HOSPITAL_COMMUNITY): Payer: Self-pay | Admitting: Pulmonary Disease

## 2020-08-28 ENCOUNTER — Encounter (HOSPITAL_COMMUNITY)
Admission: RE | Disposition: A | Discharge status: Home / Self Care | Payer: Self-pay | Source: Home / Self Care | Attending: Pulmonary Disease

## 2020-08-28 ENCOUNTER — Ambulatory Visit (HOSPITAL_COMMUNITY)
Admission: RE | Admit: 2020-08-28 | Discharge: 2020-08-28 | Disposition: A | Discharge status: Home / Self Care | Payer: BC Managed Care – PPO | Attending: Pulmonary Disease | Admitting: Pulmonary Disease

## 2020-08-28 DIAGNOSIS — Z85828 Personal history of other malignant neoplasm of skin: Secondary | ICD-10-CM | POA: Insufficient documentation

## 2020-08-28 DIAGNOSIS — R918 Other nonspecific abnormal finding of lung field: Secondary | ICD-10-CM | POA: Diagnosis not present

## 2020-08-28 DIAGNOSIS — Z7722 Contact with and (suspected) exposure to environmental tobacco smoke (acute) (chronic): Secondary | ICD-10-CM | POA: Insufficient documentation

## 2020-08-28 DIAGNOSIS — Z20822 Contact with and (suspected) exposure to covid-19: Secondary | ICD-10-CM | POA: Insufficient documentation

## 2020-08-28 DIAGNOSIS — E559 Vitamin D deficiency, unspecified: Secondary | ICD-10-CM | POA: Insufficient documentation

## 2020-08-28 DIAGNOSIS — R599 Enlarged lymph nodes, unspecified: Secondary | ICD-10-CM | POA: Insufficient documentation

## 2020-08-28 HISTORY — DX: Anxiety disorder, unspecified: F41.9

## 2020-08-28 HISTORY — PX: VIDEO BRONCHOSCOPY WITH ENDOBRONCHIAL ULTRASOUND: SHX6177

## 2020-08-28 HISTORY — PX: BRONCHIAL NEEDLE ASPIRATION BIOPSY: SHX5106

## 2020-08-28 SURGERY — BRONCHOSCOPY, WITH EBUS
Anesthesia: General | Laterality: Bilateral

## 2020-08-28 MED ORDER — LIDOCAINE 2% (20 MG/ML) 5 ML SYRINGE
INTRAMUSCULAR | Status: DC | PRN
  Administered 2020-08-28: 60 mg via INTRAVENOUS

## 2020-08-28 MED ORDER — LACTATED RINGERS IV SOLN: INTRAVENOUS | Status: DC

## 2020-08-28 MED ORDER — ONDANSETRON HCL 4 MG/2ML IJ SOLN
INTRAMUSCULAR | Status: DC | PRN
Start: 2020-08-28 — End: 2020-08-28
  Administered 2020-08-28: 4 mg via INTRAVENOUS

## 2020-08-28 MED ORDER — CHLORHEXIDINE GLUCONATE 0.12 % MT SOLN: 15.0000 mL | Freq: Once | OROMUCOSAL | Status: AC

## 2020-08-28 MED ORDER — PROPOFOL 10 MG/ML IV BOLUS
INTRAVENOUS | Status: DC | PRN
  Administered 2020-08-28: 150 mg via INTRAVENOUS
  Administered 2020-08-28: 30 mg via INTRAVENOUS

## 2020-08-28 MED ORDER — DEXAMETHASONE SODIUM PHOSPHATE 10 MG/ML IJ SOLN
INTRAMUSCULAR | Status: DC | PRN
  Administered 2020-08-28: 10 mg via INTRAVENOUS

## 2020-08-28 MED ORDER — ROCURONIUM BROMIDE 10 MG/ML (PF) SYRINGE
PREFILLED_SYRINGE | INTRAVENOUS | Status: DC | PRN
  Administered 2020-08-28: 60 mg via INTRAVENOUS

## 2020-08-28 MED ORDER — SUGAMMADEX SODIUM 200 MG/2ML IV SOLN
INTRAVENOUS | Status: DC | PRN
  Administered 2020-08-28: 200 mg via INTRAVENOUS

## 2020-08-28 MED ORDER — CHLORHEXIDINE GLUCONATE 0.12 % MT SOLN
OROMUCOSAL | Status: AC
  Administered 2020-08-28: 07:00:00 15 mL via OROMUCOSAL
  Filled 2020-08-28: qty 15

## 2020-08-28 MED ORDER — FENTANYL CITRATE (PF) 250 MCG/5ML IJ SOLN
INTRAMUSCULAR | Status: DC | PRN
  Administered 2020-08-28: 50 ug via INTRAVENOUS

## 2020-08-28 SURGICAL SUPPLY — 54 items
ADAPTER BRONCH F/PENTAX (ADAPTER) ×4 IMPLANT
ADAPTER VALVE BIOPSY EBUS (MISCELLANEOUS) IMPLANT
ADPR BSCP EDG PNTX (ADAPTER) ×2
ADPTR VALVE BIOPSY EBUS (MISCELLANEOUS)
BRUSH CYTOL CELLEBRITY 1.5X140 (MISCELLANEOUS) ×4 IMPLANT
BRUSH SUPERTRAX BIOPSY (INSTRUMENTS) IMPLANT
BRUSH SUPERTRAX NDL-TIP CYTO (INSTRUMENTS) ×4 IMPLANT
CANISTER SUCT 3000ML PPV (MISCELLANEOUS) ×4 IMPLANT
CHANNEL WORK EXTEND EDGE 180 (KITS) IMPLANT
CHANNEL WORK EXTEND EDGE 45 (KITS) IMPLANT
CHANNEL WORK EXTEND EDGE 90 (KITS) IMPLANT
CONT SPEC 4OZ CLIKSEAL STRL BL (MISCELLANEOUS) ×4 IMPLANT
COVER BACK TABLE 60X90IN (DRAPES) ×4 IMPLANT
COVER DOME SNAP 22 D (MISCELLANEOUS) ×4 IMPLANT
FILTER STRAW FLUID ASPIR (MISCELLANEOUS) IMPLANT
FORCEPS BIOP RJ4 1.8 (CUTTING FORCEPS) IMPLANT
FORCEPS BIOP SUPERTRX PREMAR (INSTRUMENTS) ×4 IMPLANT
GAUZE SPONGE 4X4 12PLY STRL (GAUZE/BANDAGES/DRESSINGS) ×4 IMPLANT
GLOVE BIO SURGEON STRL SZ7.5 (GLOVE) ×4 IMPLANT
GLOVE SURG SS PI 7.5 STRL IVOR (GLOVE) ×8 IMPLANT
GOWN STRL REUS W/ TWL LRG LVL3 (GOWN DISPOSABLE) ×4 IMPLANT
GOWN STRL REUS W/TWL LRG LVL3 (GOWN DISPOSABLE) ×8
KIT CLEAN ENDO COMPLIANCE (KITS) ×8 IMPLANT
KIT LOCATABLE GUIDE (CANNULA) IMPLANT
KIT MARKER FIDUCIAL DELIVERY (KITS) IMPLANT
KIT PROCEDURE EDGE 180 (KITS) IMPLANT
KIT PROCEDURE EDGE 45 (KITS) IMPLANT
KIT PROCEDURE EDGE 90 (KITS) IMPLANT
KIT TURNOVER KIT B (KITS) ×4 IMPLANT
MARKER SKIN DUAL TIP RULER LAB (MISCELLANEOUS) ×4 IMPLANT
NDL EBUS SONO TIP PENTAX (NEEDLE) ×2 IMPLANT
NDL SUPERTRX PREMARK BIOPSY (NEEDLE) ×2 IMPLANT
NEEDLE EBUS SONO TIP PENTAX (NEEDLE) ×4 IMPLANT
NEEDLE SUPERTRX PREMARK BIOPSY (NEEDLE) ×4 IMPLANT
NS IRRIG 1000ML POUR BTL (IV SOLUTION) ×4 IMPLANT
OIL SILICONE PENTAX (PARTS (SERVICE/REPAIRS)) ×4 IMPLANT
PAD ARMBOARD 7.5X6 YLW CONV (MISCELLANEOUS) ×8 IMPLANT
PATCHES PATIENT (LABEL) ×12 IMPLANT
SOL ANTI FOG 6CC (MISCELLANEOUS) ×2 IMPLANT
SOLUTION ANTI FOG 6CC (MISCELLANEOUS) ×2
SYR 20CC LL (SYRINGE) ×8 IMPLANT
SYR 20ML ECCENTRIC (SYRINGE) ×8 IMPLANT
SYR 50ML SLIP (SYRINGE) ×4 IMPLANT
SYR 5ML LUER SLIP (SYRINGE) ×4 IMPLANT
TOWEL OR 17X24 6PK STRL BLUE (TOWEL DISPOSABLE) ×4 IMPLANT
TRAP SPECIMEN MUCOUS 40CC (MISCELLANEOUS) IMPLANT
TUBE CONNECTING 20'X1/4 (TUBING) ×2
TUBE CONNECTING 20X1/4 (TUBING) ×6 IMPLANT
UNDERPAD 30X30 (UNDERPADS AND DIAPERS) ×4 IMPLANT
VALVE BIOPSY  SINGLE USE (MISCELLANEOUS) ×4
VALVE BIOPSY SINGLE USE (MISCELLANEOUS) ×2 IMPLANT
VALVE DISPOSABLE (MISCELLANEOUS) ×4 IMPLANT
VALVE SUCTION BRONCHIO DISP (MISCELLANEOUS) ×4 IMPLANT
WATER STERILE IRR 1000ML POUR (IV SOLUTION) ×4 IMPLANT

## 2020-08-28 NOTE — Transfer of Care (Signed)
Immediate Anesthesia Transfer of Care Note  Patient: Taylor Bailey  Procedure(s) Performed: VIDEO BRONCHOSCOPY WITH ENDOBRONCHIAL ULTRASOUND (Bilateral ) BRONCHIAL NEEDLE ASPIRATION BIOPSIES  Patient Location: PACU and Endoscopy Unit  Anesthesia Type:General  Level of Consciousness: awake, alert  and patient cooperative  Airway & Oxygen Therapy: Patient Spontanous Breathing  Post-op Assessment: Report given to RN and Post -op Vital signs reviewed and stable  Post vital signs: Reviewed and stable  Last Vitals:  Vitals Value Taken Time  BP    Temp    Pulse    Resp    SpO2      Last Pain:  Vitals:   08/28/20 0658  TempSrc:   PainSc: 0-No pain         Complications: No complications documented.

## 2020-08-28 NOTE — Discharge Instructions (Signed)
Flexible Bronchoscopy, Care After This sheet gives you information about how to care for yourself after your test. Your doctor may also give you more specific instructions. If you have problems or questions, contact your doctor. Follow these instructions at home: Eating and drinking  The day after the test, go back to your normal diet. Driving  Do not drive for 24 hours if you were given a medicine to help you relax (sedative).  Do not drive or use heavy machinery while taking prescription pain medicine. General instructions   Take over-the-counter and prescription medicines only as told by your doctor.  Return to your normal activities as told. Ask what activities are safe for you.  Do not use any products that have nicotine or tobacco in them. This includes cigarettes and e-cigarettes. If you need help quitting, ask your doctor.  Keep all follow-up visits as told by your doctor. This is important. It is very important if you had a tissue sample (biopsy) taken. Get help right away if:  You have shortness of breath that gets worse.  You get light-headed.  You feel like you are going to pass out (faint).  You have chest pain.  You cough up: ? More than a little blood. ? More blood than before. Summary  Do not eat or drink anything (not even water) for 2 hours after your test, or until your numbing medicine wears off.  Do not use cigarettes. Do not use e-cigarettes.  Get help right away if you have chest pain. This information is not intended to replace advice given to you by your health care provider. Make sure you discuss any questions you have with your health care provider. Document Revised: 08/11/2017 Document Reviewed: 09/16/2016 Elsevier Patient Education  2020 [employer].

## 2020-08-28 NOTE — Anesthesia Procedure Notes (Signed)
Procedure Name: Intubation Date/Time: 08/28/2020 7:41 AM Performed by: Griffin Dakin, CRNA Pre-anesthesia Checklist: Patient identified, Emergency Drugs available, Suction available and Patient being monitored Patient Re-evaluated:Patient Re-evaluated prior to induction Oxygen Delivery Method: Circle system utilized Preoxygenation: Pre-oxygenation with 100% oxygen Induction Type: IV induction Ventilation: Mask ventilation without difficulty Laryngoscope Size: Glidescope and 3 Grade View: Grade II Tube type: Oral Tube size: 8.5 mm Number of attempts: 1 Airway Equipment and Method: Video-laryngoscopy and Rigid stylet Placement Confirmation: ETT inserted through vocal cords under direct vision,  positive ETCO2 and breath sounds checked- equal and bilateral Secured at: 25 cm Tube secured with: Tape Dental Injury: Teeth and Oropharynx as per pre-operative assessment

## 2020-08-28 NOTE — Op Note (Signed)
Video Bronchoscopy with Endobronchial Ultrasound Procedure Note  Date of Operation: 08/28/2020  Pre-op Diagnosis: Left upper lobe lung mass  Post-op Diagnosis: Left upper lobe lung mass  Surgeon: Garner Nash, DO   Assistants: none   Anesthesia: General endotracheal anesthesia  Operation: Flexible video fiberoptic bronchoscopy with endobronchial ultrasound and biopsies.  Estimated Blood Loss: Minimal, <2EF   Complications: None   Indications and History: Taylor Bailey is a 74 y.o. female with left upper lobe lung mass.  The risks, benefits, complications, treatment options and expected outcomes were discussed with the patient.  The possibilities of pneumothorax, pneumonia, reaction to medication, pulmonary aspiration, perforation of a viscus, bleeding, failure to diagnose a condition and creating a complication requiring transfusion or operation were discussed with the patient who freely signed the consent.    Description of Procedure: The patient was examined in the preoperative area and history and data from the preprocedure consultation were reviewed. It was deemed appropriate to proceed.  The patient was taken to [facility] endoscopy room 2, identified as Luberta Mutter and the procedure verified as Flexible Video Fiberoptic Bronchoscopy.  A Time Out was held and the above information confirmed. After being taken to the operating room general anesthesia was initiated and the patient  was orally intubated. The video fiberoptic bronchoscope was introduced via the endotracheal tube and a general inspection was performed which showed normal right lung anatomy, main trachea appears normal bilateral mainstem is normal, left upper lobe and left mainstem bifurcation with a swirled/scar puckered appearance suggestive of an abnormal submucosal reaction on the other side..  The standard scope was then withdrawn and the endobronchial ultrasound was used to identify and characterize the  peritracheal, hilar and bronchial lymph nodes. Inspection showed normal peritracheal, and subcarinal lymph node spaces.  Within the left hilum and left upper lobe there was visible tumor.  We were able to peripherally use ultrasound by extending the probe into the lingula with visualization superiorly of the mass.  Looking in the left hilum there was no visible nodal structure but it did appear that the more superior left upper lobe mass was pushing down the primary vasculature.  With the ultrasound probe into the lateral aspect of the lingula looking up we were able to see a glimpse of the lesion.  I presume this is mass versus nodal structure.  Using real-time ultrasound guidance Wang needle biopsies were take from left upper lobe mass and were sent for cytology. The patient tolerated the procedure well without apparent complications.  At the end of the procedure the standard bronchoscope was inserted into the airway, bilateral aspiration of the mainstem's occurred with clearance of secretions and any remaining blood clots.  The distal mainstem's and subsegments were patent at the termination of the procedure the bronchoscope was brought to just above the main carina and there was no evidence of active bleeding. There was no significant blood loss. The bronchoscope was withdrawn. Anesthesia was reversed and the patient was taken to the PACU for recovery.   Samples: 1.  Left upper lobe/lingular mass  Plans:  The patient will be discharged from the PACU to home when recovered from anesthesia. We will review the cytology, pathology and microbiology results with the patient when they become available. Outpatient followup will be with Garner Nash, DO.   Garner Nash, DO [facility] 08/28/2020 8:36 AM

## 2020-08-28 NOTE — Interval H&P Note (Signed)
History and Physical Interval Note:  08/28/2020 7:06 AM  Taylor Bailey  has presented today for surgery, with the diagnosis of Lung mass.  The various methods of treatment have been discussed with the patient and family. After consideration of risks, benefits and other options for treatment, the patient has consented to  Procedure(s): VIDEO BRONCHOSCOPY WITH ENDOBRONCHIAL ULTRASOUND (Bilateral) VIDEO BRONCHOSCOPY WITH ENDOBRONCHIAL NAVIGATION (Bilateral) as a surgical intervention.  The patient's history has been reviewed, patient examined, no change in status, stable for surgery.  I have reviewed the patient's chart and labs.  Questions were answered to the patient's satisfaction.     [place]

## 2020-08-28 NOTE — Anesthesia Postprocedure Evaluation (Signed)
Anesthesia Post Note  Patient: Taylor Bailey  Procedure(s) Performed: VIDEO BRONCHOSCOPY WITH ENDOBRONCHIAL ULTRASOUND (Bilateral ) BRONCHIAL NEEDLE ASPIRATION BIOPSIES     Patient location during evaluation: PACU Anesthesia Type: General Level of consciousness: awake and alert Pain management: pain level controlled Vital Signs Assessment: post-procedure vital signs reviewed and stable Respiratory status: spontaneous breathing, nonlabored ventilation, respiratory function stable and patient connected to nasal cannula oxygen Cardiovascular status: blood pressure returned to baseline and stable Postop Assessment: no apparent nausea or vomiting Anesthetic complications: no   No complications documented.  Last Vitals:  Vitals:   08/28/20 0900 08/28/20 0905  BP: (!) 161/71 (!) 160/60  Pulse: 73 68  Resp: 15 16  Temp:    SpO2: 96% 95%    Last Pain:  Vitals:   08/28/20 0905  TempSrc:   PainSc: 0-No pain                 Geovannie Vilar L Musette Kisamore

## 2020-08-28 NOTE — Anesthesia Preprocedure Evaluation (Addendum)
Anesthesia Evaluation  Patient identified by MRN, date of birth, ID band Patient awake    Reviewed: Allergy & Precautions, NPO status , Patient's Chart, lab work & pertinent test results  Airway Mallampati: II  TM Distance: >3 FB Neck ROM: Full    Dental no notable dental hx. (+) Teeth Intact, Dental Advisory Given   Pulmonary neg pulmonary ROS,    Pulmonary exam normal breath sounds clear to auscultation       Cardiovascular Normal cardiovascular exam Rhythm:Regular Rate:Normal  HLD  TTE 2021 EF normal, G1DD, valves ok   Neuro/Psych PSYCHIATRIC DISORDERS Anxiety negative neurological ROS     GI/Hepatic negative GI ROS, Neg liver ROS,   Endo/Other  negative endocrine ROS  Renal/GU negative Renal ROS  negative genitourinary   Musculoskeletal negative musculoskeletal ROS (+)   Abdominal   Peds  Hematology negative hematology ROS (+)   Anesthesia Other Findings LUL lung mass  Reproductive/Obstetrics                            Anesthesia Physical Anesthesia Plan  ASA: II  Anesthesia Plan: General   Post-op Pain Management:    Induction: Intravenous  PONV Risk Score and Plan: 3 and Dexamethasone, Ondansetron and Treatment may vary due to age or medical condition  Airway Management Planned: Oral ETT  Additional Equipment:   Intra-op Plan:   Post-operative Plan: Extubation in OR  Informed Consent: I have reviewed the patients History and Physical, chart, labs and discussed the procedure including the risks, benefits and alternatives for the proposed anesthesia with the patient or authorized representative who has indicated his/her understanding and acceptance.     Dental advisory given  Plan Discussed with: CRNA  Anesthesia Plan Comments:         Anesthesia Quick Evaluation

## 2020-08-30 ENCOUNTER — Encounter (HOSPITAL_COMMUNITY): Payer: Self-pay | Admitting: Pulmonary Disease

## 2020-09-02 LAB — CYTOLOGY - NON PAP

## 2020-09-03 ENCOUNTER — Other Ambulatory Visit: Payer: Self-pay

## 2020-09-03 ENCOUNTER — Encounter (HOSPITAL_COMMUNITY)
Admission: RE | Admit: 2020-09-03 | Discharge: 2020-09-03 | Disposition: A | Discharge status: Home / Self Care | Payer: BC Managed Care – PPO | Source: Ambulatory Visit | Attending: Internal Medicine | Admitting: Internal Medicine

## 2020-09-03 DIAGNOSIS — R918 Other nonspecific abnormal finding of lung field: Secondary | ICD-10-CM | POA: Diagnosis not present

## 2020-09-03 LAB — GLUCOSE, CAPILLARY: Glucose-Capillary: 87 mg/dL (ref 70–99)

## 2020-09-03 MED ORDER — FLUDEOXYGLUCOSE F - 18 (FDG) INJECTION
7.8000 | Freq: Once | INTRAVENOUS | Status: AC | PRN
  Administered 2020-09-03: 7.8 via INTRAVENOUS

## 2020-09-08 ENCOUNTER — Encounter: Payer: Self-pay | Admitting: Internal Medicine

## 2020-09-08 ENCOUNTER — Other Ambulatory Visit: Payer: Self-pay

## 2020-09-08 ENCOUNTER — Inpatient Hospital Stay: Payer: BC Managed Care – PPO | Attending: Internal Medicine | Admitting: Internal Medicine

## 2020-09-08 ENCOUNTER — Encounter: Payer: Self-pay | Admitting: *Deleted

## 2020-09-08 ENCOUNTER — Telehealth: Payer: Self-pay | Admitting: *Deleted

## 2020-09-08 ENCOUNTER — Inpatient Hospital Stay: Payer: BC Managed Care – PPO

## 2020-09-08 DIAGNOSIS — C3412 Malignant neoplasm of upper lobe, left bronchus or lung: Secondary | ICD-10-CM | POA: Insufficient documentation

## 2020-09-08 DIAGNOSIS — R918 Other nonspecific abnormal finding of lung field: Secondary | ICD-10-CM

## 2020-09-08 DIAGNOSIS — D3A090 Benign carcinoid tumor of the bronchus and lung: Secondary | ICD-10-CM | POA: Insufficient documentation

## 2020-09-08 LAB — CBC WITH DIFFERENTIAL (CANCER CENTER ONLY)
Abs Immature Granulocytes: 0.02 10*3/uL (ref 0.00–0.07)
Basophils Absolute: 0.1 10*3/uL (ref 0.0–0.1)
Basophils Relative: 1 %
Eosinophils Absolute: 0.2 10*3/uL (ref 0.0–0.5)
Eosinophils Relative: 2 %
HCT: 39.1 % (ref 36.0–46.0)
Hemoglobin: 12.5 g/dL (ref 12.0–15.0)
Immature Granulocytes: 0 %
Lymphocytes Relative: 27 %
Lymphs Abs: 2.2 10*3/uL (ref 0.7–4.0)
MCH: 28 pg (ref 26.0–34.0)
MCHC: 32 g/dL (ref 30.0–36.0)
MCV: 87.5 fL (ref 80.0–100.0)
Monocytes Absolute: 0.6 10*3/uL (ref 0.1–1.0)
Monocytes Relative: 8 %
Neutro Abs: 5.1 10*3/uL (ref 1.7–7.7)
Neutrophils Relative %: 62 %
Platelet Count: 276 10*3/uL (ref 150–400)
RBC: 4.47 MIL/uL (ref 3.87–5.11)
RDW: 13.9 % (ref 11.5–15.5)
WBC Count: 8.1 10*3/uL (ref 4.0–10.5)
nRBC: 0 % (ref 0.0–0.2)

## 2020-09-08 LAB — CMP (CANCER CENTER ONLY)
ALT: 16 U/L (ref 0–44)
AST: 17 U/L (ref 15–41)
Albumin: 3.8 g/dL (ref 3.5–5.0)
Alkaline Phosphatase: 81 U/L (ref 38–126)
Anion gap: 5 (ref 5–15)
BUN: 13 mg/dL (ref 8–23)
CO2: 29 mmol/L (ref 22–32)
Calcium: 9.6 mg/dL (ref 8.9–10.3)
Chloride: 105 mmol/L (ref 98–111)
Creatinine: 0.79 mg/dL (ref 0.44–1.00)
GFR, Estimated: >60 mL/min (ref >60)
Glucose, Bld: 120 mg/dL — ABNORMAL HIGH (ref 70–99)
Potassium: 3.8 mmol/L (ref 3.5–5.1)
Sodium: 139 mmol/L (ref 135–145)
Total Bilirubin: 0.3 mg/dL (ref 0.3–1.2)
Total Protein: 7.5 g/dL (ref 6.5–8.1)

## 2020-09-08 NOTE — Progress Notes (Signed)
[place] [phone]   [phone]  CONSULT NOTE  REFERRING PHYSICIAN: Dr. Leory Plowman Icard  REASON FOR CONSULTATION:  74 years old white female recently diagnosed with lung cancer.  HPI Taylor Bailey is a 74 y.o. female never smoker with past medical history significant for anxiety, basal cell carcinoma, osteoporosis, vitamin D deficiency.  The patient was seen by her primary care physician and he order CT cardiac scoring on July 31, 2020 and it showed incompletely imaged masslike area in the posterior aspect of the left upper lobe.  This was followed by CT scan of the chest on August 19, 2020 and it showed probable left hilar mass measuring 2.7 x 2.3 cm concerning for metastatic disease or adenopathy.  There was large area of consolidation involving the left upper lobe and suprahilar region most consistent with postobstructive atelectasis secondary to endobronchial lesion or neoplasm.  There was also irregular density noted posteriorly in the left upper lobe which may represent inflammation but neoplasm must be considered.  The scan also showed 0.5 cm subsolid nodule in the right lower lobe.  On August 28, 2020 the patient underwent video bronchoscopy with endobronchial ultrasound procedure under the care of Dr. Valeta Harms. The final pathology ([id number]) was consistent with carcinoid. The specimen consists of spindle cells with neuroendocrine type chromatin. There is no necrosis or increase in mitotic figures. Immunohistochemistry is positive for pancytokeratin, chromogranin, synaptophysin and TTF-1. Ki-67 is low. The cells are negative for S100, desmin, SMA, CD34, and CD117. 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  The patient had a PET scan on September 03, 2020 and it showed hypermetabolism corresponding to the obstructive central left upper lobe lung lesion versus less likely suprahilar lymph node in the order of 2.3 x 2.8  cm and SUV max of 12.3 favored to represent a primary neoplasm versus less likely metastatic node.  There was hypermetabolism within the collapsed left upper lobe may simply represent postobstructive pneumonitis versus pulmonary primary/metastasis.  There is no hypermetabolic extrathoracic metastasis. Dr. Valeta Harms kindly referred the patient to me today for evaluation and recommendation regarding treatment of her condition. When seen today she is feeling fine with no concerning complaints.  She continues to exercise at regular basis. She denied having any chest pain, shortness of breath but continues to have dry cough and she related to postnasal drainage.  She has no hemoptysis.  She denied having any nausea, vomiting, diarrhea or constipation.  She denied having any headache or visual changes. Family history significant for mother diagnosed with breast cancer at age 31 and died at age 70. Father died at age 33 and had black lung disease. The patient is a widow and has 2 children a son and daughter.  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  She has no history for smoking but drinks a glass of wine occasionally and no history of drug abuse.  HPI  Past Medical History:  Diagnosis Date  . Anxiety    mediatation- last time as a teenager  . 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 Chicken pox   . Complication of anesthesia 2015   Colonoscopy  . Osteoporosis 10/2017   T score -2.9  . Vitamin D deficiency    takes 2000 units each day    Past Surgical History:  Procedure Laterality Date  . BREAST BIOPSY Left    BENIGN  .  BREAST EXCISIONAL BIOPSY Left   . BRONCHIAL NEEDLE ASPIRATION BIOPSY  08/28/2020   Procedure: BRONCHIAL NEEDLE ASPIRATION BIOPSIES;  Surgeon: Garner Nash, DO;  Location: [place];  Service: Pulmonary;;  . CATARACT EXTRACTION, BILATERAL  2018  . COLONOSCOPY    . VIDEO BRONCHOSCOPY WITH ENDOBRONCHIAL  ULTRASOUND Bilateral 08/28/2020   Procedure: VIDEO BRONCHOSCOPY WITH ENDOBRONCHIAL ULTRASOUND;  Surgeon: Garner Nash, DO;  Location: [place];  Service: Pulmonary;  Laterality: Bilateral;  . WISDOM TOOTH EXTRACTION      Family History  Problem Relation Age of Onset  . Breast cancer Mother        lived to 79. relatives have lived in 39s and 72s  . Diabetes Maternal Grandmother   . Other Father        black lung- lived to 69  . Healthy Sister   . Colon cancer Neg Hx   . Pancreatic cancer Neg Hx   . Stomach cancer Neg Hx     Social History Social History   Tobacco Use  . Smoking status: Never Smoker  . Smokeless tobacco: Never Used  Vaping Use  . Vaping Use: Never used  Substance Use Topics  . Alcohol use: Yes    Alcohol/week: 1.0 standard drink    Types: 1 Glasses of wine per week    Comment: occ glass of wine  . Drug use: No    Allergies  Allergen Reactions  . Fluoride Preparations     FLU VACCINE  . Influenza Vaccines     Hives, angioedema- monitored in office on benadryl (received at [facility])   . Prednisone     Bad shakes/hyper after 2 days. Not sure about injections     Current Outpatient Medications  Medication Sig Dispense Refill  . Cholecalciferol (VITAMIN D) 50 MCG (2000 UT) CAPS Take 2,000 Units by mouth daily. Gummie    . fluticasone (FLONASE) 50 MCG/ACT nasal spray Place 2 sprays into both nostrils daily as needed for rhinitis.     No current facility-administered medications for this visit.    Review of Systems  Constitutional: negative Eyes: negative Ears, nose, mouth, throat, and face: negative Respiratory: positive for cough Cardiovascular: negative Gastrointestinal: negative Genitourinary:negative Integument/breast: negative Hematologic/lymphatic: negative Musculoskeletal:negative Neurological: negative Behavioral/Psych: negative Endocrine: negative Allergic/Immunologic: negative  Physical Exam  YYT:KPTWS, healthy, no  distress, well nourished and well developed SKIN: skin color, texture, turgor are normal, no rashes or significant lesions HEAD: Normocephalic, No masses, lesions, tenderness or abnormalities EYES: normal, PERRLA, Conjunctiva are pink and non-injected EARS: External ears normal, Canals clear OROPHARYNX:no exudate, no erythema and lips, buccal mucosa, and tongue normal  NECK: supple, no adenopathy, no JVD LYMPH:  no palpable lymphadenopathy, no hepatosplenomegaly BREAST:not examined LUNGS: clear to auscultation , and palpation HEART: regular rate & rhythm, no murmurs and no gallops ABDOMEN:abdomen soft, non-tender, normal bowel sounds and no masses or organomegaly BACK: No CVA tenderness, Range of motion is normal EXTREMITIES:no joint deformities, effusion, or inflammation, no edema  NEURO: alert & oriented x 3 with fluent speech, no focal motor/sensory deficits  PERFORMANCE STATUS: ECOG 1  LABORATORY DATA: Lab Results  Component Value Date   WBC 8.1 09/08/2020   HGB 12.5 09/08/2020   HCT 39.1 09/08/2020   MCV 87.5 09/08/2020   PLT 276 09/08/2020      Chemistry      Component Value Date/Time   NA 139 09/08/2020 1329   K 3.8 09/08/2020 1329   CL 105 09/08/2020 1329   CO2 29  09/08/2020 1329   BUN 13 09/08/2020 1329   CREATININE 0.79 09/08/2020 1329      Component Value Date/Time   CALCIUM 9.6 09/08/2020 1329   ALKPHOS 81 09/08/2020 1329   AST 17 09/08/2020 1329   ALT 16 09/08/2020 1329   BILITOT 0.3 09/08/2020 1329       RADIOGRAPHIC STUDIES: CT CHEST W CONTRAST  Result Date: 08/20/2020 CLINICAL DATA:  Persistent cough, lung nodule. EXAM: CT CHEST WITH CONTRAST TECHNIQUE: Multidetector CT imaging of the chest was performed during intravenous contrast administration. CONTRAST:  16m OMNIPAQUE IOHEXOL 300 MG/ML  SOLN COMPARISON:  July 31, 2020.  September 25, 2012. FINDINGS: Cardiovascular: Atherosclerosis of thoracic aorta is noted without aneurysm or dissection. Mild  cardiomegaly is noted. No pericardial effusion is noted. Mediastinum/Nodes: Small sliding-type hiatal hernia is noted. Thyroid gland is unremarkable. Probable left hilar mass measuring 2.7 x 2.3 cm is noted concerning for metastatic disease or adenopathy. Lungs/Pleura: No pneumothorax or pleural effusion is noted. 5 mm sub solid nodule seen in right lower lobe best seen on image number 115 of series 4. Large area of consolidation is seen involving the left upper lobe and suprahilar region most consistent with postobstructive atelectasis secondary to endobronchial lesion or neoplasm. Irregular density measuring 4.0 x 1.5 cm is noted posteriorly in the left upper lobe which may represent inflammation, although neoplasm must be considered. Upper Abdomen: No acute abnormality. Musculoskeletal: No chest wall abnormality. No acute or significant osseous findings. IMPRESSION: 1. Probable left hilar mass is noted measuring 2.7 x 2.3 cm concerning for metastatic disease or adenopathy. 2. Large area of consolidation is seen involving the left upper lobe and suprahilar region most consistent with postobstructive atelectasis secondary to endobronchial lesion or neoplasm. Irregular density is noted posteriorly in the left upper lobe which may represent inflammation, although neoplasm must be considered. Bronchoscopy is recommended for further evaluation. These results will be called to the ordering clinician or representative by the Radiologist Assistant, and communication documented in the PACS or zVision Dashboard. 3. 5 mm sub solid nodule is noted in right lower lobe. Initial follow-up by chest CT without contrast is recommended in 3 months to confirm persistence. This recommendation follows the consensus statement: Recommendations for the Management of Subsolid Pulmonary Nodules Detected at CT: A Statement from the [place] published in Radiology 2013; 266:304-317. 4. Small sliding-type hiatal hernia. 5. Aortic  atherosclerosis. Aortic Atherosclerosis (ICD10-I70.0). Electronically Signed   By: JMarijo ConceptionM.D.   On: 08/20/2020 16:35   NM PET Image Initial (PI) Skull Base To Thigh  Result Date: 09/03/2020 CLINICAL DATA:  Initial treatment strategy for chest CT demonstrating findings suspicious for central left upper lobe primary bronchogenic carcinoma. EXAM: NUCLEAR MEDICINE PET SKULL BASE TO THIGH TECHNIQUE: 7.8 mCi F-18 FDG was injected intravenously. Full-ring PET imaging was performed from the skull base to thigh after the radiotracer. CT data was obtained and used for attenuation correction and anatomic localization. Fasting blood glucose: 87 mg/dl COMPARISON:  Chest CT of 08/19/2020. FINDINGS: Mediastinal blood pool activity: SUV max 3.1 Liver activity: SUV max NA NECK: No areas of abnormal hypermetabolism. Incidental CT findings: Bilateral carotid atherosclerosis. No cervical adenopathy. CHEST: Hypermetabolism corresponding to the obstructive central left upper lobe lung lesion versus less likely suprahilar lymph node. On the order of 2.3 x 2.8 cm and a S.U.V. max of 12.3 on 64/4. Within the area of surrounding left upper lobe collapse/consolidation, there is heterogeneous multifocal hypermetabolism, including at a S.U.V. max of  8.1. Incidental CT findings: Deferred to recent diagnostic CT. No acute superimposed process. ABDOMEN/PELVIS: No abdominopelvic nodal hypermetabolism. Low anal hypermetabolism measures a S.U.V. max of 7.3 and is without CT correlate. Incidental CT findings: Normal adrenal glands. Abdominal aortic atherosclerosis. Mild pelvic floor laxity. SKELETON: No abnormal marrow activity. Incidental CT findings: none IMPRESSION: 1. Left upper lobe primary bronchogenic carcinoma. An obstructive central left upper lobe hypermetabolic lesion is favored to represent a primary neoplasm versus less likely metastatic node. Hypermetabolism within the collapsed left upper lobe may simply represent  postobstructive pneumonitis versus pulmonary primary/metastasis. If not already performed, recommend multidisciplinary thoracic oncology consultation for eventual sampling via bronchoscopy. 2. No hypermetabolic extrathoracic metastasis. 3. Anal hypermetabolism is most likely physiologic. This could be correlated with physical exam. Electronically Signed   By: Abigail Miyamoto M.D.   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   PLAN: I had a lengthy discussion with the patient today about her current condition and treatment options.  I personally and independently reviewed the PET scan images and discussed the result and showed the images to the patient today. I strongly recommend for the patient to see cardiothoracic surgery for evaluation of surgical resection as the best option for treatment of her carcinoid tumor. I have the patient is not a good surgical candidate, I will discuss with her other treatment options but usually chemotherapy is not very effective in this disease.  I will see her back for follow-up visit after evaluation by cardiothoracic surgery. The patient was advised to call immediately if she has any other concerning symptoms in the interval.  The patient voices understanding of current disease status and treatment options and is in agreement with the current care plan.  All questions were answered. The patient knows to call the clinic with any problems, questions or concerns. We can certainly see the patient much sooner if necessary.  Thank you so much for allowing me to participate in the care of Taylor Bailey. I will continue to follow up the patient with you and assist in her care.  The total time spent in the appointment was 60 minutes.  Disclaimer: This note was dictated with voice recognition  software. Similar sounding words can inadvertently be transcribed and may not be corrected upon review.   Taylor Bailey September 08, 2020, 2:39 PM

## 2020-09-08 NOTE — Progress Notes (Signed)
I spoke with Taylor Bailey today.  She is newly dx carcinoid lung cancer.  I gave her information on dx, treatment, and next steps.  Per Dr. Julien Nordmann, I completed referral to [facility].  I will update thoracic office of referral.

## 2020-09-09 ENCOUNTER — Encounter: Payer: Self-pay | Admitting: *Deleted

## 2020-09-09 ENCOUNTER — Telehealth: Payer: Self-pay | Admitting: Internal Medicine

## 2020-09-09 NOTE — Progress Notes (Signed)
I followed up on Taylor Bailey's appt to see thoracic surgery.  She is seeing Dr. Roxan Hockey on 1/5.  I called to make sure she was aware of appt location. I spoke to Ms. Elgersma and was unclear of where his office is located.  I updated her.  She verbalized understanding.

## 2020-09-09 NOTE — Telephone Encounter (Signed)
Per 12/28 los, no changes made to pt schedule

## 2020-09-16 ENCOUNTER — Encounter: Payer: BC Managed Care – PPO | Admitting: Thoracic Surgery (Cardiothoracic Vascular Surgery)

## 2020-09-22 ENCOUNTER — Other Ambulatory Visit: Payer: Self-pay

## 2020-09-22 ENCOUNTER — Institutional Professional Consult (permissible substitution) (INDEPENDENT_AMBULATORY_CARE_PROVIDER_SITE_OTHER): Payer: Medicare Other | Admitting: Thoracic Surgery (Cardiothoracic Vascular Surgery)

## 2020-09-22 ENCOUNTER — Other Ambulatory Visit: Payer: Self-pay | Admitting: *Deleted

## 2020-09-22 ENCOUNTER — Encounter: Payer: Self-pay | Admitting: Thoracic Surgery (Cardiothoracic Vascular Surgery)

## 2020-09-22 VITALS — BP 170/79 | HR 85 | Resp 20 | Ht 63.0 in | Wt 155.0 lb

## 2020-09-22 DIAGNOSIS — R918 Other nonspecific abnormal finding of lung field: Secondary | ICD-10-CM | POA: Diagnosis not present

## 2020-09-22 NOTE — Progress Notes (Signed)
PCP is [name], MD Referring Provider is Curt Bears, MD  Chief Complaint  Patient presents with  . Lung Mass    Surgical Consult, Chest CT 08/19/20, PET Scan 09/03/20,    HPI: Taylor Bailey is sent for consultation regarding a left lung mass.  Taylor Bailey is a 75 year old woman with a past history of skin cancer, osteoporosis and vitamin D deficiency.  She recently retired from working at [employer].  Prior to retirement she had a work-up including a CT for coronary calcium score.  Her calcium score was 0 but she was found to have a left upper lobe lung mass.  A CT of the chest was done which showed a probable 2.7 x 2.3 cm left hilar mass with a large area of consolidation, likely postobstructive atelectasis.  She then had a PET/CT, which showed the obstructive central left upper lobe lesion was hypermetabolic.  There was also hypermetabolism out of the lung.  It was unclear if this was a peripheral lesion with central adenopathy, or just a central lesion with activity and the postobstructive lung.  She underwent bronchoscopy by Dr. Valeta Harms.  No endobronchial mass lesion was seen.  EBUS was used to obtain samples.  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  She is a lifelong non-smoker.  She feels well.  She denies any chest pain, pressure, tightness, shortness of breath, cough, wheezing, change in appetite, or weight loss.  She is very active and works out on a regular basis.  Zubrod Score: At the time of surgery this patient's most appropriate activity status/level should be described as: [x]     0    Normal activity, no symptoms []     1    Restricted in physical strenuous activity but ambulatory, able to do out light work []     2    Ambulatory and capable of self care, unable to do work activities, up and about >50 % of waking hours                              []     3    Only limited self care, in bed greater than 50% of waking hours []     4    Completely disabled,  no self care, confined to bed or chair []     5    Moribund  Past Medical History:  Diagnosis Date  . Anxiety    mediatation- last time as a teenager  . 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 Chicken pox   . Complication of anesthesia 2015   Colonoscopy  . Osteoporosis 10/2017   T score -2.9  . Vitamin D deficiency    takes 2000 units each day    Past Surgical History:  Procedure Laterality Date  . BREAST BIOPSY Left    BENIGN  . BREAST EXCISIONAL BIOPSY Left   . BRONCHIAL NEEDLE ASPIRATION BIOPSY  08/28/2020   Procedure: BRONCHIAL NEEDLE ASPIRATION BIOPSIES;  Surgeon: Garner Nash, DO;  Location: [facility];  Service: Pulmonary;;  . CATARACT EXTRACTION, BILATERAL  2018  . COLONOSCOPY    . VIDEO BRONCHOSCOPY WITH ENDOBRONCHIAL ULTRASOUND Bilateral 08/28/2020   Procedure: VIDEO BRONCHOSCOPY WITH ENDOBRONCHIAL ULTRASOUND;  Surgeon: Garner Nash, DO;  Location: [facility];  Service: Pulmonary;  Laterality: Bilateral;  . WISDOM TOOTH EXTRACTION      Family History  Problem Relation Age of  Onset  . Breast cancer Mother        lived to 38. relatives have lived in 78s and 106s  . Diabetes Maternal Grandmother   . Other Father        black lung- lived to 30  . Healthy Sister   . Colon cancer Neg Hx   . Pancreatic cancer Neg Hx   . Stomach cancer Neg Hx     Social History Social History   Tobacco Use  . Smoking status: Never Smoker  . Smokeless tobacco: Never Used  Vaping Use  . Vaping Use: Never used  Substance Use Topics  . Alcohol use: Yes    Alcohol/week: 1.0 standard drink    Types: 1 Glasses of wine per week    Comment: occ glass of wine  . Drug use: No    Current Outpatient Medications  Medication Sig Dispense Refill  . fluticasone (FLONASE) 50 MCG/ACT nasal spray Place 2 sprays into both nostrils daily as needed for rhinitis.    . Cholecalciferol (VITAMIN D) 50 MCG (2000 UT) CAPS Take 2,000 Units by mouth  daily. Gummie     No current facility-administered medications for this visit.    Allergies  Allergen Reactions  . Fluoride Preparations     FLU VACCINE  . Influenza Vaccines     Hives, angioedema- monitored in office on benadryl (received at [facility])   . Prednisone     Bad shakes/hyper after 2 days. Not sure about injections     Review of Systems  Constitutional: Negative for activity change, fatigue and unexpected weight change.  HENT: Negative for trouble swallowing and voice change.   Eyes: Negative for visual disturbance.  Respiratory: Negative for cough, shortness of breath and wheezing.   Cardiovascular: Negative for chest pain and leg swelling.  Gastrointestinal: Negative for abdominal distention and abdominal pain.  Genitourinary: Negative for difficulty urinating and dysuria.  Musculoskeletal: Negative for arthralgias and myalgias.  Neurological: Negative for seizures, syncope and weakness.  Hematological: Negative for adenopathy. Does not bruise/bleed easily.  All other systems reviewed and are negative.   BP (!) 170/79   Pulse 85   Resp 20   Ht 5\' 3"  (1.6 m)   Wt 155 lb (70.3 kg)   SpO2 98% Comment: RA  BMI 27.46 kg/m  Physical Exam Vitals reviewed.  Constitutional:      General: She is not in acute distress.    Appearance: Normal appearance.  HENT:     Head: Normocephalic and atraumatic.  Eyes:     General: No scleral icterus.    Extraocular Movements: Extraocular movements intact.  Cardiovascular:     Rate and Rhythm: Normal rate and regular rhythm.     Heart sounds: Normal heart sounds. No murmur heard. No friction rub. No gallop.   Pulmonary:     Effort: Pulmonary effort is normal. No respiratory distress.     Breath sounds: Normal breath sounds. No wheezing or rales.  Abdominal:     General: There is no distension.     Palpations: Abdomen is soft.     Tenderness: There is no abdominal tenderness.  Musculoskeletal:        General: No  swelling.     Cervical back: Neck supple.  Lymphadenopathy:     Cervical: No cervical adenopathy.  Skin:    General: Skin is warm and dry.  Neurological:     General: No focal deficit present.     Mental Status: She is alert and oriented  to person, place, and time.     Diagnostic Tests: CT CHEST WITH CONTRAST  TECHNIQUE: Multidetector CT imaging of the chest was performed during intravenous contrast administration.  CONTRAST:  61mL OMNIPAQUE IOHEXOL 300 MG/ML  SOLN  COMPARISON:  July 31, 2020.  September 25, 2012.  FINDINGS: Cardiovascular: Atherosclerosis of thoracic aorta is noted without aneurysm or dissection. Mild cardiomegaly is noted. No pericardial effusion is noted.  Mediastinum/Nodes: Small sliding-type hiatal hernia is noted. Thyroid gland is unremarkable. Probable left hilar mass measuring 2.7 x 2.3 cm is noted concerning for metastatic disease or adenopathy.  Lungs/Pleura: No pneumothorax or pleural effusion is noted. 5 mm sub solid nodule seen in right lower lobe best seen on image number 115 of series 4. Large area of consolidation is seen involving the left upper lobe and suprahilar region most consistent with postobstructive atelectasis secondary to endobronchial lesion or neoplasm. Irregular density measuring 4.0 x 1.5 cm is noted posteriorly in the left upper lobe which may represent inflammation, although neoplasm must be considered.  Upper Abdomen: No acute abnormality.  Musculoskeletal: No chest wall abnormality. No acute or significant osseous findings.  IMPRESSION: 1. Probable left hilar mass is noted measuring 2.7 x 2.3 cm concerning for metastatic disease or adenopathy. 2. Large area of consolidation is seen involving the left upper lobe and suprahilar region most consistent with postobstructive atelectasis secondary to endobronchial lesion or neoplasm. Irregular density is noted posteriorly in the left upper lobe which  may represent inflammation, although neoplasm must be considered. Bronchoscopy is recommended for further evaluation. These results will be called to the ordering clinician or representative by the Radiologist Assistant, and communication documented in the PACS or zVision Dashboard. 3. 5 mm sub solid nodule is noted in right lower lobe. Initial follow-up by chest CT without contrast is recommended in 3 months to confirm persistence. This recommendation follows the consensus statement: Recommendations for the Management of Subsolid Pulmonary Nodules Detected at CT: A Statement from the [place] as published in Radiology 2013; 266:304-317. 4. Small sliding-type hiatal hernia. 5. Aortic atherosclerosis.  Aortic Atherosclerosis (ICD10-I70.0).   Electronically Signed   By: Marijo Conception M.D.   On: 08/20/2020 16:35 NUCLEAR MEDICINE PET SKULL BASE TO THIGH  TECHNIQUE: 7.8 mCi F-18 FDG was injected intravenously. Full-ring PET imaging was performed from the skull base to thigh after the radiotracer. CT data was obtained and used for attenuation correction and anatomic localization.  Fasting blood glucose: 87 mg/dl  COMPARISON:  Chest CT of 08/19/2020.  FINDINGS: Mediastinal blood pool activity: SUV max 3.1  Liver activity: SUV max NA  NECK: No areas of abnormal hypermetabolism.  Incidental CT findings: Bilateral carotid atherosclerosis. No cervical adenopathy.  CHEST: Hypermetabolism corresponding to the obstructive central left upper lobe lung lesion versus less likely suprahilar lymph node. On the order of 2.3 x 2.8 cm and a S.U.V. max of 12.3 on 64/4.  Within the area of surrounding left upper lobe collapse/consolidation, there is heterogeneous multifocal hypermetabolism, including at a S.U.V. max of 8.1.  Incidental CT findings: Deferred to recent diagnostic CT. No acute superimposed process.  ABDOMEN/PELVIS: No abdominopelvic nodal  hypermetabolism. Low anal hypermetabolism measures a S.U.V. max of 7.3 and is without CT correlate.  Incidental CT findings: Normal adrenal glands. Abdominal aortic atherosclerosis. Mild pelvic floor laxity.  SKELETON: No abnormal marrow activity.  Incidental CT findings: none  IMPRESSION: 1. Left upper lobe primary bronchogenic carcinoma. An obstructive central left upper lobe hypermetabolic lesion is favored to represent  a primary neoplasm versus less likely metastatic node. Hypermetabolism within the collapsed left upper lobe may simply represent postobstructive pneumonitis versus pulmonary primary/metastasis. If not already performed, recommend multidisciplinary thoracic oncology consultation for eventual sampling via bronchoscopy. 2. No hypermetabolic extrathoracic metastasis. 3. Anal hypermetabolism is most likely physiologic. This could be correlated with physical exam.   Electronically Signed   By: Abigail Miyamoto M.D.   On: 09/03/2020 14:14 I reviewed the CT and PET/CT images.  There appears to be a central mass plus minus hilar adenopathy with some postobstructive atelectasis.  Findings most likely represent T1 N1 stage IIb disease.  Possibly T1 N0 stage Ia with a more central portion is mass rather than adenopathy.  FINAL MICROSCOPIC DIAGNOSIS:   A. LUNG, LUL MASS, FINE NEEDLE ASPIRATION:  - Findings consistent with carcinoid, see comment.   Impression: Taylor Bailey is a 75 year old non-smoker with a history of skin cancer, osteoporosis, and vitamin D deficiency.  She recently had a CT for coronary calcium scoring.  Her coronary calcium score was 0.  However, she was incidentally found to have a left upper lobe mass.  CT and PET/CT confirmed a left upper lobe mass with likely postobstructive adenopathy.  Biopsy showed a typical carcinoid tumor.  The findings on CT and PET are consistent with either central lesion with postobstructive atelectasis and pneumonitis.   Is also possible there is a more peripheral T1 lesion with obstruction and then more central N1 adenopathy.  The degree of hypermetabolism is pretty impressive.  Overall the findings are very impressive for a typical carcinoid tumor.  In any event the treatment of choice is surgical resection.  This will require a left upper lobectomy.  The plan will be to do this robotically.  She does understand there is a possibility of needing to convert to thoracotomy.  I described the proposed procedure of robotic left upper lobectomy to Taylor Bailey.  I informed her of the general nature of the procedure including the need for general anesthesia, the incisions to be used, the use of a drainage tube postoperatively, the expected hospital stay, and the overall recovery.  I informed her of the indications, risks, benefits, and alternatives.  She understands the risks include, but are not limited to death, MI, DVT, PE, bleeding, possible need for transfusion, infection, prolonged air leak, cardiac arrhythmias, conversion to thoracotomy, as well as the possibility of other unforeseeable complications.  She understands and accepts the risk and agrees to proceed.  Since she will need a lobectomy we should check pulmonary function testing preoperatively.  I would be surprised if there were any concerns with that.  Plan: Xi robotic assisted left upper lobectomy on Thursday, October 01, 2020  I spent over 45 minutes in review of images, records, and in consultation with Taylor Bailey today.  Melrose Nakayama, MD [facility] [phone]

## 2020-09-22 NOTE — H&P (View-Only) (Signed)
PCP is [name], MD Referring Provider is Curt Bears, MD  Chief Complaint  Patient presents with  . Lung Mass    Surgical Consult, Chest CT 08/19/20, PET Scan 09/03/20,    HPI: Taylor Bailey is sent for consultation regarding a left lung mass.  Taylor Bailey is a 75 year old woman with a past history of skin cancer, osteoporosis and vitamin D deficiency.  She recently retired from working at [employer].  Prior to retirement she had a work-up including a CT for coronary calcium score.  Her calcium score was 0 but she was found to have a left upper lobe lung mass.  A CT of the chest was done which showed a probable 2.7 x 2.3 cm left hilar mass with a large area of consolidation, likely postobstructive atelectasis.  She then had a PET/CT, which showed the obstructive central left upper lobe lesion was hypermetabolic.  There was also hypermetabolism out of the lung.  It was unclear if this was a peripheral lesion with central adenopathy, or just a central lesion with activity and the postobstructive lung.  She underwent bronchoscopy by Dr. Valeta Harms.  No endobronchial mass lesion was seen.  EBUS was used to obtain samples.  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  She is a lifelong non-smoker.  She feels well.  She denies any chest pain, pressure, tightness, shortness of breath, cough, wheezing, change in appetite, or weight loss.  She is very active and works out on a regular basis.  Zubrod Score: At the time of surgery this patient's most appropriate activity status/level should be described as: [x]     0    Normal activity, no symptoms []     1    Restricted in physical strenuous activity but ambulatory, able to do out light work []     2    Ambulatory and capable of self care, unable to do work activities, up and about >50 % of waking hours                              []     3    Only limited self care, in bed greater than 50% of waking hours []     4    Completely disabled,  no self care, confined to bed or chair []     5    Moribund  Past Medical History:  Diagnosis Date  . Anxiety    mediatation- last time as a teenager  . 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 Chicken pox   . Complication of anesthesia 2015   Colonoscopy  . Osteoporosis 10/2017   T score -2.9  . Vitamin D deficiency    takes 2000 units each day    Past Surgical History:  Procedure Laterality Date  . BREAST BIOPSY Left    BENIGN  . BREAST EXCISIONAL BIOPSY Left   . BRONCHIAL NEEDLE ASPIRATION BIOPSY  08/28/2020   Procedure: BRONCHIAL NEEDLE ASPIRATION BIOPSIES;  Surgeon: Garner Nash, DO;  Location: [facility];  Service: Pulmonary;;  . CATARACT EXTRACTION, BILATERAL  2018  . COLONOSCOPY    . VIDEO BRONCHOSCOPY WITH ENDOBRONCHIAL ULTRASOUND Bilateral 08/28/2020   Procedure: VIDEO BRONCHOSCOPY WITH ENDOBRONCHIAL ULTRASOUND;  Surgeon: Garner Nash, DO;  Location: [facility];  Service: Pulmonary;  Laterality: Bilateral;  . WISDOM TOOTH EXTRACTION      Family History  Problem Relation Age of  Onset  . Breast cancer Mother        lived to 34. relatives have lived in 64s and 50s  . Diabetes Maternal Grandmother   . Other Father        black lung- lived to 72  . Healthy Sister   . Colon cancer Neg Hx   . Pancreatic cancer Neg Hx   . Stomach cancer Neg Hx     Social History Social History   Tobacco Use  . Smoking status: Never Smoker  . Smokeless tobacco: Never Used  Vaping Use  . Vaping Use: Never used  Substance Use Topics  . Alcohol use: Yes    Alcohol/week: 1.0 standard drink    Types: 1 Glasses of wine per week    Comment: occ glass of wine  . Drug use: No    Current Outpatient Medications  Medication Sig Dispense Refill  . fluticasone (FLONASE) 50 MCG/ACT nasal spray Place 2 sprays into both nostrils daily as needed for rhinitis.    . Cholecalciferol (VITAMIN D) 50 MCG (2000 UT) CAPS Take 2,000 Units by mouth  daily. Gummie     No current facility-administered medications for this visit.    Allergies  Allergen Reactions  . Fluoride Preparations     FLU VACCINE  . Influenza Vaccines     Hives, angioedema- monitored in office on benadryl (received at [facility])   . Prednisone     Bad shakes/hyper after 2 days. Not sure about injections     Review of Systems  Constitutional: Negative for activity change, fatigue and unexpected weight change.  HENT: Negative for trouble swallowing and voice change.   Eyes: Negative for visual disturbance.  Respiratory: Negative for cough, shortness of breath and wheezing.   Cardiovascular: Negative for chest pain and leg swelling.  Gastrointestinal: Negative for abdominal distention and abdominal pain.  Genitourinary: Negative for difficulty urinating and dysuria.  Musculoskeletal: Negative for arthralgias and myalgias.  Neurological: Negative for seizures, syncope and weakness.  Hematological: Negative for adenopathy. Does not bruise/bleed easily.  All other systems reviewed and are negative.   BP (!) 170/79   Pulse 85   Resp 20   Ht 5\' 3"  (1.6 m)   Wt 155 lb (70.3 kg)   SpO2 98% Comment: RA  BMI 27.46 kg/m  Physical Exam Vitals reviewed.  Constitutional:      General: She is not in acute distress.    Appearance: Normal appearance.  HENT:     Head: Normocephalic and atraumatic.  Eyes:     General: No scleral icterus.    Extraocular Movements: Extraocular movements intact.  Cardiovascular:     Rate and Rhythm: Normal rate and regular rhythm.     Heart sounds: Normal heart sounds. No murmur heard. No friction rub. No gallop.   Pulmonary:     Effort: Pulmonary effort is normal. No respiratory distress.     Breath sounds: Normal breath sounds. No wheezing or rales.  Abdominal:     General: There is no distension.     Palpations: Abdomen is soft.     Tenderness: There is no abdominal tenderness.  Musculoskeletal:        General: No  swelling.     Cervical back: Neck supple.  Lymphadenopathy:     Cervical: No cervical adenopathy.  Skin:    General: Skin is warm and dry.  Neurological:     General: No focal deficit present.     Mental Status: She is alert and oriented  to person, place, and time.     Diagnostic Tests: CT CHEST WITH CONTRAST  TECHNIQUE: Multidetector CT imaging of the chest was performed during intravenous contrast administration.  CONTRAST:  43mL OMNIPAQUE IOHEXOL 300 MG/ML  SOLN  COMPARISON:  July 31, 2020.  September 25, 2012.  FINDINGS: Cardiovascular: Atherosclerosis of thoracic aorta is noted without aneurysm or dissection. Mild cardiomegaly is noted. No pericardial effusion is noted.  Mediastinum/Nodes: Small sliding-type hiatal hernia is noted. Thyroid gland is unremarkable. Probable left hilar mass measuring 2.7 x 2.3 cm is noted concerning for metastatic disease or adenopathy.  Lungs/Pleura: No pneumothorax or pleural effusion is noted. 5 mm sub solid nodule seen in right lower lobe best seen on image number 115 of series 4. Large area of consolidation is seen involving the left upper lobe and suprahilar region most consistent with postobstructive atelectasis secondary to endobronchial lesion or neoplasm. Irregular density measuring 4.0 x 1.5 cm is noted posteriorly in the left upper lobe which may represent inflammation, although neoplasm must be considered.  Upper Abdomen: No acute abnormality.  Musculoskeletal: No chest wall abnormality. No acute or significant osseous findings.  IMPRESSION: 1. Probable left hilar mass is noted measuring 2.7 x 2.3 cm concerning for metastatic disease or adenopathy. 2. Large area of consolidation is seen involving the left upper lobe and suprahilar region most consistent with postobstructive atelectasis secondary to endobronchial lesion or neoplasm. Irregular density is noted posteriorly in the left upper lobe which  may represent inflammation, although neoplasm must be considered. Bronchoscopy is recommended for further evaluation. These results will be called to the ordering clinician or representative by the Radiologist Assistant, and communication documented in the PACS or zVision Dashboard. 3. 5 mm sub solid nodule is noted in right lower lobe. Initial follow-up by chest CT without contrast is recommended in 3 months to confirm persistence. This recommendation follows the consensus statement: Recommendations for the Management of Subsolid Pulmonary Nodules Detected at CT: A Statement from the [place] as published in Radiology 2013; 266:304-317. 4. Small sliding-type hiatal hernia. 5. Aortic atherosclerosis.  Aortic Atherosclerosis (ICD10-I70.0).   Electronically Signed   By: Marijo Conception M.D.   On: 08/20/2020 16:35 NUCLEAR MEDICINE PET SKULL BASE TO THIGH  TECHNIQUE: 7.8 mCi F-18 FDG was injected intravenously. Full-ring PET imaging was performed from the skull base to thigh after the radiotracer. CT data was obtained and used for attenuation correction and anatomic localization.  Fasting blood glucose: 87 mg/dl  COMPARISON:  Chest CT of 08/19/2020.  FINDINGS: Mediastinal blood pool activity: SUV max 3.1  Liver activity: SUV max NA  NECK: No areas of abnormal hypermetabolism.  Incidental CT findings: Bilateral carotid atherosclerosis. No cervical adenopathy.  CHEST: Hypermetabolism corresponding to the obstructive central left upper lobe lung lesion versus less likely suprahilar lymph node. On the order of 2.3 x 2.8 cm and a S.U.V. max of 12.3 on 64/4.  Within the area of surrounding left upper lobe collapse/consolidation, there is heterogeneous multifocal hypermetabolism, including at a S.U.V. max of 8.1.  Incidental CT findings: Deferred to recent diagnostic CT. No acute superimposed process.  ABDOMEN/PELVIS: No abdominopelvic nodal  hypermetabolism. Low anal hypermetabolism measures a S.U.V. max of 7.3 and is without CT correlate.  Incidental CT findings: Normal adrenal glands. Abdominal aortic atherosclerosis. Mild pelvic floor laxity.  SKELETON: No abnormal marrow activity.  Incidental CT findings: none  IMPRESSION: 1. Left upper lobe primary bronchogenic carcinoma. An obstructive central left upper lobe hypermetabolic lesion is favored to represent  a primary neoplasm versus less likely metastatic node. Hypermetabolism within the collapsed left upper lobe may simply represent postobstructive pneumonitis versus pulmonary primary/metastasis. If not already performed, recommend multidisciplinary thoracic oncology consultation for eventual sampling via bronchoscopy. 2. No hypermetabolic extrathoracic metastasis. 3. Anal hypermetabolism is most likely physiologic. This could be correlated with physical exam.   Electronically Signed   By: Abigail Miyamoto M.D.   On: 09/03/2020 14:14 I reviewed the CT and PET/CT images.  There appears to be a central mass plus minus hilar adenopathy with some postobstructive atelectasis.  Findings most likely represent T1 N1 stage IIb disease.  Possibly T1 N0 stage Ia with a more central portion is mass rather than adenopathy.  FINAL MICROSCOPIC DIAGNOSIS:   A. LUNG, LUL MASS, FINE NEEDLE ASPIRATION:  - Findings consistent with carcinoid, see comment.   Impression: Taylor Bailey is a 75 year old non-smoker with a history of skin cancer, osteoporosis, and vitamin D deficiency.  She recently had a CT for coronary calcium scoring.  Her coronary calcium score was 0.  However, she was incidentally found to have a left upper lobe mass.  CT and PET/CT confirmed a left upper lobe mass with likely postobstructive adenopathy.  Biopsy showed a typical carcinoid tumor.  The findings on CT and PET are consistent with either central lesion with postobstructive atelectasis and pneumonitis.   Is also possible there is a more peripheral T1 lesion with obstruction and then more central N1 adenopathy.  The degree of hypermetabolism is pretty impressive.  Overall the findings are very impressive for a typical carcinoid tumor.  In any event the treatment of choice is surgical resection.  This will require a left upper lobectomy.  The plan will be to do this robotically.  She does understand there is a possibility of needing to convert to thoracotomy.  I described the proposed procedure of robotic left upper lobectomy to [name].  I informed her of the general nature of the procedure including the need for general anesthesia, the incisions to be used, the use of a drainage tube postoperatively, the expected hospital stay, and the overall recovery.  I informed her of the indications, risks, benefits, and alternatives.  She understands the risks include, but are not limited to death, MI, DVT, PE, bleeding, possible need for transfusion, infection, prolonged air leak, cardiac arrhythmias, conversion to thoracotomy, as well as the possibility of other unforeseeable complications.  She understands and accepts the risk and agrees to proceed.  Since she will need a lobectomy we should check pulmonary function testing preoperatively.  I would be surprised if there were any concerns with that.  Plan: Xi robotic assisted left upper lobectomy on Thursday, October 01, 2020  I spent over 45 minutes in review of images, records, and in consultation with Ms. Pester today.  Melrose Nakayama, MD [facility] [phone]

## 2020-09-23 ENCOUNTER — Encounter: Payer: Self-pay | Admitting: *Deleted

## 2020-09-28 NOTE — Progress Notes (Signed)
[id number] - [place], [place] - [address] [facility] [place] [address] Phone: [phone] Fax: [phone]      Your procedure is scheduled on 10/01/2020.  Report to [facility] Main Entrance "A" at 10:30 A.M., and check in at the Admitting office.  Call this number if you have problems the morning of surgery:  [phone]  Call [phone] if you have any questions prior to your surgery date Monday-Friday 8am-4pm    Remember:  Do not eat or drink after midnight the night before your surgery    Take these medicines the morning of surgery with A SIP OF WATER  fluticasone (FLONASE)- if needed  As of today, STOP taking any Aspirin (unless otherwise instructed by your surgeon) Aleve, Naproxen, Ibuprofen, Motrin, Advil, Goody's, BC's, all herbal medications, fish oil, and all vitamins.                      Do not wear jewelry, make up, or nail polish            Do not wear lotions, powders, perfumes/colognes, or deodorant.            Do not shave 48 hours prior to surgery.              Do not bring valuables to the hospital.            [facility] is not responsible for any belongings or valuables.  Do NOT Smoke (Tobacco/Vaping) or drink Alcohol 24 hours prior to your procedure If you use a CPAP at night, you may bring all equipment for your overnight stay.   Contacts, glasses, dentures or bridgework may not be worn into surgery.      For patients admitted to the hospital, discharge time will be determined by your treatment team.   Patients discharged the day of surgery will not be allowed to drive home, and someone needs to stay with them for 24 hours.    Special instructions:   [place]- Preparing For Surgery  Before surgery, you can play an important role. Because skin is not sterile, your skin needs to be as free of germs as possible. You can reduce the number of germs on your skin by washing with CHG (chlorahexidine gluconate) Soap before  surgery.  CHG is an antiseptic cleaner which kills germs and bonds with the skin to continue killing germs even after washing.    Oral Hygiene is also important to reduce your risk of infection.  Remember - BRUSH YOUR TEETH THE MORNING OF SURGERY WITH YOUR REGULAR TOOTHPASTE  Please do not use if you have an allergy to CHG or antibacterial soaps. If your skin becomes reddened/irritated stop using the CHG.  Do not shave (including legs and underarms) for at least 48 hours prior to first CHG shower. It is OK to shave your face.  Please follow these instructions carefully.   1. Shower the NIGHT BEFORE SURGERY and the MORNING OF SURGERY with CHG Soap.   2. If you chose to wash your hair, wash your hair first as usual with your normal shampoo.  3. After you shampoo, rinse your hair and body thoroughly to remove the shampoo.  4. Use CHG as you would any other liquid soap. You can apply CHG directly to the skin and wash gently with a scrungie or a clean washcloth.   5. Apply the CHG Soap to your body ONLY FROM THE NECK DOWN.  Do  not use on open wounds or open sores. Avoid contact with your eyes, ears, mouth and genitals (private parts). Wash Face and genitals (private parts)  with your normal soap.   6. Wash thoroughly, paying special attention to the area where your surgery will be performed.  7. Thoroughly rinse your body with warm water from the neck down.  8. DO NOT shower/wash with your normal soap after using and rinsing off the CHG Soap.  9. Pat yourself dry with a CLEAN TOWEL.  10. Wear CLEAN PAJAMAS to bed the night before surgery  11. Place CLEAN SHEETS on your bed the night of your first shower and DO NOT SLEEP WITH PETS.   Day of Surgery: Take a shower.  Wear Clean/Comfortable clothing the morning of surgery Do not apply any deodorants/lotions.   Remember to brush your teeth WITH YOUR REGULAR TOOTHPASTE.   Please read over the following fact sheets that you were  given.

## 2020-09-29 ENCOUNTER — Other Ambulatory Visit: Payer: Self-pay

## 2020-09-29 ENCOUNTER — Other Ambulatory Visit (HOSPITAL_COMMUNITY)
Admission: RE | Admit: 2020-09-29 | Discharge: 2020-09-29 | Disposition: A | Discharge status: Home / Self Care | Payer: Medicare Other | Source: Ambulatory Visit | Attending: Thoracic Surgery (Cardiothoracic Vascular Surgery) | Admitting: Thoracic Surgery (Cardiothoracic Vascular Surgery)

## 2020-09-29 ENCOUNTER — Encounter (HOSPITAL_COMMUNITY)
Admission: RE | Admit: 2020-09-29 | Discharge: 2020-09-29 | Disposition: A | Discharge status: Home / Self Care | Payer: Medicare Other | Source: Ambulatory Visit | Attending: Thoracic Surgery (Cardiothoracic Vascular Surgery) | Admitting: Thoracic Surgery (Cardiothoracic Vascular Surgery)

## 2020-09-29 ENCOUNTER — Encounter (HOSPITAL_COMMUNITY): Payer: Self-pay

## 2020-09-29 DIAGNOSIS — R918 Other nonspecific abnormal finding of lung field: Secondary | ICD-10-CM

## 2020-09-29 DIAGNOSIS — Z01818 Encounter for other preprocedural examination: Secondary | ICD-10-CM | POA: Insufficient documentation

## 2020-09-29 DIAGNOSIS — Z20822 Contact with and (suspected) exposure to covid-19: Secondary | ICD-10-CM | POA: Insufficient documentation

## 2020-09-29 DIAGNOSIS — Z01812 Encounter for preprocedural laboratory examination: Secondary | ICD-10-CM | POA: Insufficient documentation

## 2020-09-29 LAB — CBC
HCT: 39.5 % (ref 36.0–46.0)
Hemoglobin: 13 g/dL (ref 12.0–15.0)
MCH: 28.6 pg (ref 26.0–34.0)
MCHC: 32.9 g/dL (ref 30.0–36.0)
MCV: 87 fL (ref 80.0–100.0)
Platelets: 276 10*3/uL (ref 150–400)
RBC: 4.54 MIL/uL (ref 3.87–5.11)
RDW: 13.8 % (ref 11.5–15.5)
WBC: 5.9 10*3/uL (ref 4.0–10.5)
nRBC: 0 % (ref 0.0–0.2)

## 2020-09-29 LAB — SARS CORONAVIRUS 2 (TAT 6-24 HRS): SARS Coronavirus 2: NEGATIVE

## 2020-09-29 LAB — COMPREHENSIVE METABOLIC PANEL
ALT: 16 U/L (ref 0–44)
AST: 19 U/L (ref 15–41)
Albumin: 3.8 g/dL (ref 3.5–5.0)
Alkaline Phosphatase: 73 U/L (ref 38–126)
Anion gap: 10 (ref 5–15)
BUN: 11 mg/dL (ref 8–23)
CO2: 21 mmol/L — ABNORMAL LOW (ref 22–32)
Calcium: 9.3 mg/dL (ref 8.9–10.3)
Chloride: 105 mmol/L (ref 98–111)
Creatinine, Ser: 0.58 mg/dL (ref 0.44–1.00)
GFR, Estimated: >60 mL/min (ref >60)
Glucose, Bld: 97 mg/dL (ref 70–99)
Potassium: 4 mmol/L (ref 3.5–5.1)
Sodium: 136 mmol/L (ref 135–145)
Total Bilirubin: 0.7 mg/dL (ref 0.3–1.2)
Total Protein: 6.9 g/dL (ref 6.5–8.1)

## 2020-09-29 LAB — TYPE AND SCREEN
ABO/RH(D): O POS
Antibody Screen: NEGATIVE

## 2020-09-29 LAB — URINALYSIS, ROUTINE W REFLEX MICROSCOPIC
Bacteria, UA: NONE SEEN
Bilirubin Urine: NEGATIVE
Glucose, UA: NEGATIVE mg/dL
Ketones, ur: NEGATIVE mg/dL
Leukocytes,Ua: NEGATIVE
Nitrite: NEGATIVE
Protein, ur: NEGATIVE mg/dL
Specific Gravity, Urine: 1.006 (ref 1.005–1.030)
pH: 7 (ref 5.0–8.0)

## 2020-09-29 LAB — BLOOD GAS, ARTERIAL
Acid-base deficit: 0.7 mmol/L (ref 0.0–2.0)
Bicarbonate: 23.1 mmol/L (ref 20.0–28.0)
FIO2: 21
O2 Saturation: 97 %
Patient temperature: 37
pCO2 arterial: 36.1 mmHg (ref 32.0–48.0)
pH, Arterial: 7.422 (ref 7.350–7.450)
pO2, Arterial: 89.3 mmHg (ref 83.0–108.0)

## 2020-09-29 LAB — APTT: aPTT: 28 seconds (ref 24–36)

## 2020-09-29 LAB — PROTIME-INR
INR: 0.9 (ref 0.8–1.2)
Prothrombin Time: 11.9 seconds (ref 11.4–15.2)

## 2020-09-29 LAB — SURGICAL PCR SCREEN
MRSA, PCR: NEGATIVE
Staphylococcus aureus: NEGATIVE

## 2020-09-29 NOTE — Progress Notes (Signed)
Anesthesia Chart Review:  Case: [id number] Date/Time: 10/01/20 1211   Procedure: XI ROBOTIC ASSISTED THORASCOPY-LEFT UPPER LOBECTOMY, possible thoracotomy (Left Chest)   Anesthesia type: General   Pre-op diagnosis: LUL CARCINOID TUMOR   Location: [facility] OR ROOM 10 / [facility]   Surgeons: Melrose Nakayama, MD      DISCUSSION: Patient is a 75 year old female scheduled for the above procedure. She was found to have LUL lung mas on recent CT Coronary Ca scan. 08/19/20 CT showed probable left hilar mass with postobstructive atelectasis and concern for endobronchial lesion. 08/28/20 bronchoscopy with LUL FNA by Dr. Valeta Harms revealed cytology consistent with carcinoid.    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  Dr. Roxan Hockey classified her Zubrod score as 0 (normal activity, no symptoms). Coronary Ca score of 0 last year 07/31/20.   09/29/20 UA and COVID-19 test are in process. Anesthesia team to evaluate on the day of surgery.   VS: BP (!) 151/77   Pulse 73   Temp 36.7 C (Oral)   Resp 18   Ht 5\' 5"  (1.651 m)   Wt 71.4 kg   SpO2 100%   BMI 26.18 kg/m    PROVIDERS: [name], MD is PCP  June Leap, DO is pulmonologist Curt Bears, MD is HEM-ONC   LABS: Labs reviewed: Acceptable for surgery. (all labs ordered are listed, but only abnormal results are displayed)  Labs Reviewed  COMPREHENSIVE METABOLIC PANEL - Abnormal; Notable for the following components:      Result Value   CO2 21 (*)    All other components within normal limits  BLOOD GAS, ARTERIAL - Abnormal; Notable for the following components:   Allens test (pass/fail) BRACHIAL ARTERY (*)    All other components within normal limits  SURGICAL PCR SCREEN  CBC  PROTIME-INR  APTT  URINALYSIS, ROUTINE W REFLEX MICROSCOPIC  TYPE AND SCREEN     IMAGES: CXR 09/29/20: FINDINGS: Redemonstration of left upper lung  consolidation. Discrete left hilar mass is better delineated on recent CT or PET. No pneumothorax or pleural effusion. No new focal consolidation. Cardiomediastinal silhouette within normal limits. No acute osseous abnormality. IMPRESSION: Redemonstration of left upper lung consolidation.  PET Scan 09/03/20: IMPRESSION: 1. Left upper lobe primary bronchogenic carcinoma. An obstructive central left upper lobe hypermetabolic lesion is favored to represent a primary neoplasm versus less likely metastatic node. Hypermetabolism within the collapsed left upper lobe may simply represent postobstructive pneumonitis versus pulmonary primary/metastasis. If not already performed, recommend multidisciplinary thoracic oncology consultation for eventual sampling via bronchoscopy. 2. No hypermetabolic extrathoracic metastasis. 3. Anal hypermetabolism is most likely physiologic. This could be correlated with physical exam.   EKG: 09/29/20: Normal sinus rhythm Septal infarct , age undetermined Abnormal ECG   CV: CT Cardiac scoring 07/31/20: IMPRESSION: 1. Patient's total coronary artery calcium score is 0 which indicates a very low (but nonzero) risk of major adverse cardiovascular events over the next 10 years. 2. Incompletely imaged masslike area in the posterior aspect of the left upper lobe. Although this could simply represent a focus of airspace consolidation from pneumonia, there is also fullness in the left hilar region which may reflect underlying lymphadenopathy, and further evaluation with contrast enhanced chest CT is strongly recommended at this time to better evaluate this finding and establish a baseline for future follow-up examinations.  Echo 07/31/20: IMPRESSIONS  1. Left ventricular ejection fraction, by estimation, is 55  to 60%. The  left ventricle has normal function. The left ventricle has no regional  wall motion abnormalities. Left ventricular diastolic  parameters are  consistent with Grade I diastolic  dysfunction (impaired relaxation).  2. Right ventricular systolic function is normal. The right ventricular  size is normal. There is normal pulmonary artery systolic pressure.  3. The mitral valve is normal in structure. No evidence of mitral valve  regurgitation.  4. The aortic valve was not well visualized. Aortic valve regurgitation  is not visualized. No aortic stenosis is present.  5. The inferior vena cava is normal in size with greater than 50%  respiratory variability, suggesting right atrial pressure of 3 mmHg.   Carotid US 07/31/20: Summary:  - Right Carotid: Velocities in the right ICA are consistent with a 1-39% stenosis.  - Left Carotid: Velocities in the left ICA are consistent with a 1-39% stenosis.  - Vertebrals: Bilateral vertebral arteries demonstrate antegrade flow.  - Subclavians: Normal flow hemodynamics were seen in bilateral subclavian arteries.    Past Medical History:  Diagnosis Date  . Anxiety    mediatation- last time as a teenager  . 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 Chicken pox   . Complication of anesthesia 2015   Colonoscopy  . Osteoporosis 10/2017   T score -2.9  . Vitamin D deficiency    takes 2000 units each day    Past Surgical History:  Procedure Laterality Date  . BREAST BIOPSY Left    BENIGN  . BREAST EXCISIONAL BIOPSY Left   . BRONCHIAL NEEDLE ASPIRATION BIOPSY  08/28/2020   Procedure: BRONCHIAL NEEDLE ASPIRATION BIOPSIES;  Surgeon: Garner Nash, DO;  Location: [facility];  Service: Pulmonary;;  . CATARACT EXTRACTION, BILATERAL  2018  . COLONOSCOPY    . TUBAL LIGATION    . VIDEO BRONCHOSCOPY WITH ENDOBRONCHIAL ULTRASOUND Bilateral 08/28/2020   Procedure: VIDEO BRONCHOSCOPY WITH ENDOBRONCHIAL ULTRASOUND;  Surgeon: Garner Nash, DO;  Location: [facility];  Service: Pulmonary;  Laterality: Bilateral;  . WISDOM TOOTH EXTRACTION       MEDICATIONS: . Cholecalciferol (VITAMIN D) 50 MCG (2000 UT) CAPS  . fluticasone (FLONASE) 50 MCG/ACT nasal spray   No current facility-administered medications for this encounter.    Myra Gianotti, PA-C Surgical Short Stay/Anesthesiology [facility] Phone [phone] [facility] Phone [phone] 09/29/2020 2:53 PM

## 2020-09-29 NOTE — Progress Notes (Signed)
PCP - Garret Reddish and Dr. Barbaraann Faster at [facility] - denies  PPM/ICD - denies   Chest x-ray - 09/29/20 EKG - 09/29/20 Stress Test - denies ECHO - 07/31/2020 Cardiac Cath - denies  Sleep Study - no   NO Diabetes   Patient instructed to hold all Aspirin, NSAID's, herbal medications, fish oil and vitamins 7 days prior to surgery.   ERAS Protcol -no   COVID TEST- 09/29/2020 before PAT appointment   Anesthesia review: no  Patient denies shortness of breath, fever, cough and chest pain at PAT appointment   All instructions explained to the patient, with a verbal understanding of the material. Patient agrees to go over the instructions while at home for a better understanding. Patient also instructed to self quarantine after being tested for COVID-19. The opportunity to ask questions was provided.

## 2020-09-29 NOTE — Anesthesia Preprocedure Evaluation (Addendum)
Anesthesia Evaluation  Patient identified by MRN, date of birth, ID band Patient awake    Reviewed: Allergy & Precautions, NPO status , Patient's Chart, lab work & pertinent test results  Airway Mallampati: III  TM Distance: >3 FB Neck ROM: Full    Dental no notable dental hx.    Pulmonary neg pulmonary ROS,    Pulmonary exam normal breath sounds clear to auscultation       Cardiovascular negative cardio ROS Normal cardiovascular exam Rhythm:Regular Rate:Normal  ECG: NSR, rate 65  ECHO: 1. Left ventricular ejection fraction, by estimation, is 55 to 60%. The left ventricle has normal function. The left ventricle has no regional wall motion abnormalities. Left ventricular diastolic parameters are consistent with Grade I diastolic dysfunction (impaired relaxation). 2. Right ventricular systolic function is normal. The right ventricular size is normal. There is normal pulmonary artery systolic pressure. 3. The mitral valve is normal in structure. No evidence of mitral valve regurgitation. 4. The aortic valve was not well visualized. Aortic valve regurgitation is not visualized. No aortic stenosis is present. 5. The inferior vena cava is normal in size with greater than 50% respiratory variability, suggesting right atrial pressure of 3 mmHg.   Neuro/Psych Anxiety negative neurological ROS     GI/Hepatic negative GI ROS, Neg liver ROS,   Endo/Other  negative endocrine ROS  Renal/GU negative Renal ROS     Musculoskeletal negative musculoskeletal ROS (+)   Abdominal   Peds  Hematology negative hematology ROS (+)   Anesthesia Other Findings LUL CARCINOID TUMOR  Reproductive/Obstetrics                            Anesthesia Physical Anesthesia Plan  ASA: II  Anesthesia Plan: General   Post-op Pain Management:    Induction: Intravenous  PONV Risk Score and Plan: 3 and Ondansetron,  Dexamethasone, Midazolam and Treatment may vary due to age or medical condition  Airway Management Planned: Double Lumen EBT  Additional Equipment:   Intra-op Plan:   Post-operative Plan: Extubation in OR  Informed Consent: I have reviewed the patients History and Physical, chart, labs and discussed the procedure including the risks, benefits and alternatives for the proposed anesthesia with the patient or authorized representative who has indicated his/her understanding and acceptance.     Dental advisory given  Plan Discussed with: CRNA  Anesthesia Plan Comments: (Reviewed PAT note written 09/29/2020 by Myra Gianotti, PA-C. )      Anesthesia Quick Evaluation

## 2020-09-30 ENCOUNTER — Ambulatory Visit (HOSPITAL_COMMUNITY)
Admission: RE | Admit: 2020-09-30 | Discharge: 2020-09-30 | Disposition: A | Discharge status: Home / Self Care | Payer: Medicare Other | Source: Ambulatory Visit | Attending: Thoracic Surgery (Cardiothoracic Vascular Surgery) | Admitting: Thoracic Surgery (Cardiothoracic Vascular Surgery)

## 2020-09-30 DIAGNOSIS — R918 Other nonspecific abnormal finding of lung field: Secondary | ICD-10-CM

## 2020-09-30 LAB — PULMONARY FUNCTION TEST
DL/VA % pred: 83 %
DL/VA: 3.39 ml/min/mmHg/L
DLCO cor % pred: 64 %
DLCO cor: 13.25 ml/min/mmHg
DLCO unc % pred: 63 %
DLCO unc: 13.08 ml/min/mmHg
FEF 25-75 Post: 1.77 L/sec
FEF 25-75 Pre: 1.44 L/sec
FEF2575-%Change-Post: 22 %
FEF2575-%Pred-Post: 94 %
FEF2575-%Pred-Pre: 77 %
FEV1-%Change-Post: 5 %
FEV1-%Pred-Post: 84 %
FEV1-%Pred-Pre: 80 %
FEV1-Post: 2 L
FEV1-Pre: 1.9 L
FEV1FVC-%Change-Post: 0 %
FEV1FVC-%Pred-Pre: 97 %
FEV6-%Change-Post: 5 %
FEV6-%Pred-Post: 89 %
FEV6-%Pred-Pre: 84 %
FEV6-Post: 2.67 L
FEV6-Pre: 2.54 L
FEV6FVC-%Change-Post: 0 %
FEV6FVC-%Pred-Post: 102 %
FEV6FVC-%Pred-Pre: 103 %
FVC-%Change-Post: 5 %
FVC-%Pred-Post: 86 %
FVC-%Pred-Pre: 82 %
FVC-Post: 2.73 L
FVC-Pre: 2.58 L
Post FEV1/FVC ratio: 73 %
Post FEV6/FVC ratio: 98 %
Pre FEV1/FVC ratio: 74 %
Pre FEV6/FVC Ratio: 98 %
RV % pred: 179 %
RV: 4.22 L
TLC % pred: 125 %
TLC: 6.7 L

## 2020-09-30 MED ORDER — ALBUTEROL SULFATE (2.5 MG/3ML) 0.083% IN NEBU
2.5000 mg | INHALATION_SOLUTION | Freq: Once | RESPIRATORY_TRACT | Status: AC
Start: 2020-09-30 — End: 2020-09-30
  Administered 2020-09-30: 2.5 mg via RESPIRATORY_TRACT

## 2020-10-01 ENCOUNTER — Inpatient Hospital Stay (HOSPITAL_COMMUNITY): Payer: Medicare Other

## 2020-10-01 ENCOUNTER — Inpatient Hospital Stay (HOSPITAL_COMMUNITY)
Admission: RE | Admit: 2020-10-01 | Discharge: 2020-10-05 | DRG: 164 | Disposition: A | Discharge status: Home / Self Care | Payer: Medicare Other | Attending: Thoracic Surgery (Cardiothoracic Vascular Surgery) | Admitting: Thoracic Surgery (Cardiothoracic Vascular Surgery)

## 2020-10-01 ENCOUNTER — Inpatient Hospital Stay (HOSPITAL_COMMUNITY): Payer: Medicare Other | Admitting: Vascular Surgery

## 2020-10-01 ENCOUNTER — Encounter (HOSPITAL_COMMUNITY): Payer: Self-pay | Admitting: Thoracic Surgery (Cardiothoracic Vascular Surgery)

## 2020-10-01 ENCOUNTER — Inpatient Hospital Stay (HOSPITAL_COMMUNITY): Payer: Medicare Other | Admitting: Certified Registered Nurse Anesthetist

## 2020-10-01 ENCOUNTER — Other Ambulatory Visit: Payer: Self-pay

## 2020-10-01 ENCOUNTER — Encounter (HOSPITAL_COMMUNITY)
Admission: RE | Disposition: A | Discharge status: Home / Self Care | Payer: Self-pay | Source: Home / Self Care | Attending: Thoracic Surgery (Cardiothoracic Vascular Surgery)

## 2020-10-01 DIAGNOSIS — Z887 Allergy status to serum and vaccine status: Secondary | ICD-10-CM | POA: Diagnosis not present

## 2020-10-01 DIAGNOSIS — C3412 Malignant neoplasm of upper lobe, left bronchus or lung: Principal | ICD-10-CM | POA: Diagnosis present

## 2020-10-01 DIAGNOSIS — Z20822 Contact with and (suspected) exposure to covid-19: Secondary | ICD-10-CM | POA: Diagnosis present

## 2020-10-01 DIAGNOSIS — Z888 Allergy status to other drugs, medicaments and biological substances status: Secondary | ICD-10-CM | POA: Diagnosis not present

## 2020-10-01 DIAGNOSIS — M810 Age-related osteoporosis without current pathological fracture: Secondary | ICD-10-CM | POA: Diagnosis present

## 2020-10-01 DIAGNOSIS — R918 Other nonspecific abnormal finding of lung field: Secondary | ICD-10-CM | POA: Diagnosis present

## 2020-10-01 DIAGNOSIS — C771 Secondary and unspecified malignant neoplasm of intrathoracic lymph nodes: Secondary | ICD-10-CM | POA: Diagnosis present

## 2020-10-01 DIAGNOSIS — E559 Vitamin D deficiency, unspecified: Secondary | ICD-10-CM | POA: Diagnosis present

## 2020-10-01 DIAGNOSIS — Z902 Acquired absence of lung [part of]: Secondary | ICD-10-CM

## 2020-10-01 DIAGNOSIS — C779 Secondary and unspecified malignant neoplasm of lymph node, unspecified: Secondary | ICD-10-CM | POA: Diagnosis present

## 2020-10-01 DIAGNOSIS — Z4682 Encounter for fitting and adjustment of non-vascular catheter: Secondary | ICD-10-CM

## 2020-10-01 DIAGNOSIS — J939 Pneumothorax, unspecified: Secondary | ICD-10-CM

## 2020-10-01 DIAGNOSIS — Z85828 Personal history of other malignant neoplasm of skin: Secondary | ICD-10-CM

## 2020-10-01 HISTORY — PX: NODE DISSECTION: SHX5269

## 2020-10-01 HISTORY — PX: INTERCOSTAL NERVE BLOCK: SHX5021

## 2020-10-01 LAB — ABO/RH: ABO/RH(D): O POS

## 2020-10-01 SURGERY — LOBECTOMY, LUNG, ROBOT-ASSISTED, USING VATS
Anesthesia: General | Site: Chest | Laterality: Left

## 2020-10-01 MED ORDER — MORPHINE SULFATE (PF) 2 MG/ML IV SOLN
2.0000 mg | INTRAVENOUS | Status: DC | PRN
Start: 2020-10-01 — End: 2020-10-05

## 2020-10-01 MED ORDER — PROPOFOL 10 MG/ML IV BOLUS
INTRAVENOUS | Status: DC | PRN
  Administered 2020-10-01: 150 mg via INTRAVENOUS
  Administered 2020-10-01 (×2): 50 mg via INTRAVENOUS

## 2020-10-01 MED ORDER — BISACODYL 5 MG PO TBEC
10.0000 mg | DELAYED_RELEASE_TABLET | Freq: Every day | ORAL | Status: DC
  Administered 2020-10-02 – 2020-10-04 (×3): 10 mg via ORAL
  Filled 2020-10-01 (×3): qty 2

## 2020-10-01 MED ORDER — MIDAZOLAM HCL 2 MG/2ML IJ SOLN
INTRAMUSCULAR | Status: AC
  Filled 2020-10-01: qty 2

## 2020-10-01 MED ORDER — FENTANYL CITRATE (PF) 250 MCG/5ML IJ SOLN
INTRAMUSCULAR | Status: AC
  Filled 2020-10-01: qty 5

## 2020-10-01 MED ORDER — CHLORHEXIDINE GLUCONATE 0.12 % MT SOLN
15.0000 mL | Freq: Once | OROMUCOSAL | Status: AC
  Administered 2020-10-01: 15 mL via OROMUCOSAL
  Filled 2020-10-01: qty 15

## 2020-10-01 MED ORDER — EPHEDRINE SULFATE-NACL 50-0.9 MG/10ML-% IV SOSY
PREFILLED_SYRINGE | INTRAVENOUS | Status: DC | PRN
  Administered 2020-10-01: 10 mg via INTRAVENOUS

## 2020-10-01 MED ORDER — SENNOSIDES-DOCUSATE SODIUM 8.6-50 MG PO TABS
1.0000 | ORAL_TABLET | Freq: Every day | ORAL | Status: DC
  Administered 2020-10-01 – 2020-10-04 (×3): 1 via ORAL
  Filled 2020-10-01 (×4): qty 1

## 2020-10-01 MED ORDER — ONDANSETRON HCL 4 MG/2ML IJ SOLN
INTRAMUSCULAR | Status: AC
  Filled 2020-10-01: qty 2

## 2020-10-01 MED ORDER — LACTATED RINGERS IV SOLN: INTRAVENOUS | Status: DC

## 2020-10-01 MED ORDER — EPHEDRINE 5 MG/ML INJ
INTRAVENOUS | Status: AC
  Filled 2020-10-01: qty 10

## 2020-10-01 MED ORDER — KETOROLAC TROMETHAMINE 15 MG/ML IJ SOLN
15.0000 mg | Freq: Four times a day (QID) | INTRAMUSCULAR | Status: AC
  Administered 2020-10-01 – 2020-10-03 (×8): 15 mg via INTRAVENOUS
  Filled 2020-10-01 (×8): qty 1

## 2020-10-01 MED ORDER — VASOPRESSIN 20 UNIT/ML IV SOLN
INTRAVENOUS | Status: AC
  Filled 2020-10-01: qty 1

## 2020-10-01 MED ORDER — ALBUMIN HUMAN 5 % IV SOLN
INTRAVENOUS | Status: DC | PRN
Start: 2020-10-01 — End: 2020-10-01

## 2020-10-01 MED ORDER — SUGAMMADEX SODIUM 200 MG/2ML IV SOLN
INTRAVENOUS | Status: DC | PRN
  Administered 2020-10-01 (×2): 50 mg via INTRAVENOUS
  Administered 2020-10-01: 100 mg via INTRAVENOUS
  Administered 2020-10-01: 50 mg via INTRAVENOUS

## 2020-10-01 MED ORDER — FENTANYL CITRATE (PF) 250 MCG/5ML IJ SOLN
INTRAMUSCULAR | Status: DC | PRN
  Administered 2020-10-01: 50 ug via INTRAVENOUS
  Administered 2020-10-01 (×2): 25 ug via INTRAVENOUS
  Administered 2020-10-01: 150 ug via INTRAVENOUS
  Administered 2020-10-01: 100 ug via INTRAVENOUS
  Administered 2020-10-01: 50 ug via INTRAVENOUS

## 2020-10-01 MED ORDER — DIPHENHYDRAMINE HCL 50 MG/ML IJ SOLN
INTRAMUSCULAR | Status: DC | PRN
  Administered 2020-10-01: 25 mg via INTRAVENOUS

## 2020-10-01 MED ORDER — LIDOCAINE 2% (20 MG/ML) 5 ML SYRINGE
INTRAMUSCULAR | Status: DC | PRN
  Administered 2020-10-01: 60 mg via INTRAVENOUS

## 2020-10-01 MED ORDER — LACTATED RINGERS IV SOLN: INTRAVENOUS | Status: DC | PRN

## 2020-10-01 MED ORDER — DEXAMETHASONE SODIUM PHOSPHATE 10 MG/ML IJ SOLN
INTRAMUSCULAR | Status: DC | PRN
  Administered 2020-10-01: 5 mg via INTRAVENOUS

## 2020-10-01 MED ORDER — OXYCODONE HCL 5 MG PO TABS: 5.0000 mg | ORAL_TABLET | ORAL | Status: DC | PRN

## 2020-10-01 MED ORDER — ORAL CARE MOUTH RINSE: 15.0000 mL | Freq: Once | OROMUCOSAL | Status: AC

## 2020-10-01 MED ORDER — BUPIVACAINE LIPOSOME 1.3 % IJ SUSP
20.0000 mL | Freq: Once | INTRAMUSCULAR | Status: DC
  Filled 2020-10-01: qty 20

## 2020-10-01 MED ORDER — ONDANSETRON HCL 4 MG/2ML IJ SOLN: 4.0000 mg | Freq: Once | INTRAMUSCULAR | Status: DC | PRN

## 2020-10-01 MED ORDER — 0.9 % SODIUM CHLORIDE (POUR BTL) OPTIME
TOPICAL | Status: DC | PRN
  Administered 2020-10-01 (×2): 1000 mL

## 2020-10-01 MED ORDER — PROPOFOL 10 MG/ML IV BOLUS
INTRAVENOUS | Status: AC
  Filled 2020-10-01: qty 20

## 2020-10-01 MED ORDER — BUPIVACAINE HCL (PF) 0.5 % IJ SOLN
INTRAMUSCULAR | Status: AC
  Filled 2020-10-01: qty 30

## 2020-10-01 MED ORDER — FAMOTIDINE IN NACL 20-0.9 MG/50ML-% IV SOLN
20.0000 mg | Freq: Once | INTRAVENOUS | Status: AC
  Administered 2020-10-01: 20 mg via INTRAVENOUS
  Filled 2020-10-01: qty 50

## 2020-10-01 MED ORDER — HEMOSTATIC AGENTS (NO CHARGE) OPTIME
TOPICAL | Status: DC | PRN
Start: 2020-10-01 — End: 2020-10-01
  Administered 2020-10-01: 3 via TOPICAL

## 2020-10-01 MED ORDER — DEXTROSE IN LACTATED RINGERS 5 % IV SOLN: INTRAVENOUS | Status: DC

## 2020-10-01 MED ORDER — DIPHENHYDRAMINE HCL 50 MG/ML IJ SOLN
INTRAMUSCULAR | Status: AC
  Filled 2020-10-01: qty 1

## 2020-10-01 MED ORDER — SODIUM CHLORIDE 0.9 % IV SOLN: INTRAVENOUS | Status: DC | PRN

## 2020-10-01 MED ORDER — SODIUM CHLORIDE 0.9% IV SOLUTION
INTRAVENOUS | Status: AC | PRN
  Administered 2020-10-01: 1000 mL via INTRAMUSCULAR

## 2020-10-01 MED ORDER — ONDANSETRON HCL 4 MG/2ML IJ SOLN
INTRAMUSCULAR | Status: DC | PRN
  Administered 2020-10-01: 4 mg via INTRAVENOUS

## 2020-10-01 MED ORDER — CEFAZOLIN SODIUM-DEXTROSE 2-4 GM/100ML-% IV SOLN
2.0000 g | INTRAVENOUS | Status: AC
  Administered 2020-10-01 (×2): 2 g via INTRAVENOUS
  Filled 2020-10-01: qty 100

## 2020-10-01 MED ORDER — ACETAMINOPHEN 160 MG/5ML PO SOLN: 1000.0000 mg | Freq: Four times a day (QID) | ORAL | Status: DC

## 2020-10-01 MED ORDER — ACETAMINOPHEN 500 MG PO TABS
1000.0000 mg | ORAL_TABLET | Freq: Once | ORAL | Status: AC
  Administered 2020-10-01: 1000 mg via ORAL
  Filled 2020-10-01: qty 2

## 2020-10-01 MED ORDER — ENOXAPARIN SODIUM 40 MG/0.4ML ~~LOC~~ SOLN
40.0000 mg | Freq: Every day | SUBCUTANEOUS | Status: DC
  Administered 2020-10-02 – 2020-10-04 (×3): 40 mg via SUBCUTANEOUS
  Filled 2020-10-01 (×3): qty 0.4

## 2020-10-01 MED ORDER — PHENYLEPHRINE 40 MCG/ML (10ML) SYRINGE FOR IV PUSH (FOR BLOOD PRESSURE SUPPORT)
PREFILLED_SYRINGE | INTRAVENOUS | Status: DC | PRN
  Administered 2020-10-01: 80 ug via INTRAVENOUS

## 2020-10-01 MED ORDER — LIDOCAINE 2% (20 MG/ML) 5 ML SYRINGE
INTRAMUSCULAR | Status: AC
  Filled 2020-10-01: qty 5

## 2020-10-01 MED ORDER — PHENYLEPHRINE HCL-NACL 10-0.9 MG/250ML-% IV SOLN
INTRAVENOUS | Status: DC | PRN
  Administered 2020-10-01: 40 ug/min via INTRAVENOUS

## 2020-10-01 MED ORDER — FENTANYL CITRATE (PF) 100 MCG/2ML IJ SOLN: 25.0000 ug | INTRAMUSCULAR | Status: DC | PRN

## 2020-10-01 MED ORDER — SODIUM CHLORIDE 0.9 % IV SOLN
50.0000 ug/h | INTRAVENOUS | Status: DC
  Administered 2020-10-01: 25 ug/h via INTRAVENOUS
  Filled 2020-10-01: qty 1

## 2020-10-01 MED ORDER — ONDANSETRON HCL 4 MG/2ML IJ SOLN
4.0000 mg | Freq: Four times a day (QID) | INTRAMUSCULAR | Status: DC | PRN
  Administered 2020-10-02 – 2020-10-03 (×3): 4 mg via INTRAVENOUS
  Filled 2020-10-01 (×3): qty 2

## 2020-10-01 MED ORDER — ROCURONIUM BROMIDE 10 MG/ML (PF) SYRINGE
PREFILLED_SYRINGE | INTRAVENOUS | Status: DC | PRN
  Administered 2020-10-01: 10 mg via INTRAVENOUS
  Administered 2020-10-01: 60 mg via INTRAVENOUS
  Administered 2020-10-01: 20 mg via INTRAVENOUS
  Administered 2020-10-01: 40 mg via INTRAVENOUS
  Administered 2020-10-01: 20 mg via INTRAVENOUS

## 2020-10-01 MED ORDER — CEFAZOLIN SODIUM-DEXTROSE 2-4 GM/100ML-% IV SOLN
2.0000 g | Freq: Three times a day (TID) | INTRAVENOUS | Status: AC
  Administered 2020-10-01 – 2020-10-02 (×2): 2 g via INTRAVENOUS
  Filled 2020-10-01 (×2): qty 100

## 2020-10-01 MED ORDER — BUPIVACAINE LIPOSOME 1.3 % IJ SUSP
INTRAMUSCULAR | Status: DC | PRN
  Administered 2020-10-01: 94 mL

## 2020-10-01 MED ORDER — ROCURONIUM BROMIDE 10 MG/ML (PF) SYRINGE
PREFILLED_SYRINGE | INTRAVENOUS | Status: AC
  Filled 2020-10-01: qty 10

## 2020-10-01 MED ORDER — ACETAMINOPHEN 500 MG PO TABS
1000.0000 mg | ORAL_TABLET | Freq: Four times a day (QID) | ORAL | Status: DC
  Administered 2020-10-01 – 2020-10-03 (×10): 1000 mg via ORAL
  Filled 2020-10-01 (×11): qty 2

## 2020-10-01 SURGICAL SUPPLY — 115 items
ADH SKN CLS APL DERMABOND .7 (GAUZE/BANDAGES/DRESSINGS) ×1
BAG TISS RTRVL C300 12X14 (MISCELLANEOUS) ×1
BLADE CLIPPER SURG (BLADE) IMPLANT
CANISTER SUCT 3000ML PPV (MISCELLANEOUS) ×4 IMPLANT
CANNULA REDUC XI 12-8 STAPL (CANNULA) ×4
CANNULA REDUCER 12-8 DVNC XI (CANNULA) ×2 IMPLANT
CATH THORACIC 28FR (CATHETERS) IMPLANT
CATH THORACIC 28FR RT ANG (CATHETERS) IMPLANT
CATH THORACIC 36FR (CATHETERS) IMPLANT
CATH THORACIC 36FR RT ANG (CATHETERS) IMPLANT
CLIP VESOCCLUDE MED 6/CT (CLIP) IMPLANT
CNTNR URN SCR LID CUP LEK RST (MISCELLANEOUS) ×5 IMPLANT
CONN ST 1/4X3/8  BEN (MISCELLANEOUS)
CONN ST 1/4X3/8 BEN (MISCELLANEOUS) IMPLANT
CONN Y 3/8X3/8X3/8  BEN (MISCELLANEOUS)
CONN Y 3/8X3/8X3/8 BEN (MISCELLANEOUS) IMPLANT
CONT SPEC 4OZ STRL OR WHT (MISCELLANEOUS) ×20
DEFOGGER SCOPE WARMER CLEARIFY (MISCELLANEOUS) ×2 IMPLANT
DERMABOND ADVANCED (GAUZE/BANDAGES/DRESSINGS) ×1
DERMABOND ADVANCED .7 DNX12 (GAUZE/BANDAGES/DRESSINGS) ×1 IMPLANT
DRAIN CHANNEL 28F RND 3/8 FF (WOUND CARE) ×1 IMPLANT
DRAIN CHANNEL 32F RND 10.7 FF (WOUND CARE) IMPLANT
DRAPE ARM DVNC X/XI (DISPOSABLE) ×4 IMPLANT
DRAPE COLUMN DVNC XI (DISPOSABLE) ×1 IMPLANT
DRAPE CV SPLIT W-CLR ANES SCRN (DRAPES) ×2 IMPLANT
DRAPE DA VINCI XI ARM (DISPOSABLE) ×8
DRAPE DA VINCI XI COLUMN (DISPOSABLE) ×2
DRAPE INCISE IOBAN 66X45 STRL (DRAPES) IMPLANT
DRAPE ORTHO SPLIT 77X108 STRL (DRAPES) ×2
DRAPE SURG ORHT 6 SPLT 77X108 (DRAPES) ×1 IMPLANT
ELECT BLADE 6.5 EXT (BLADE) ×2 IMPLANT
ELECT REM PT RETURN 9FT ADLT (ELECTROSURGICAL) ×2
ELECTRODE REM PT RTRN 9FT ADLT (ELECTROSURGICAL) ×1 IMPLANT
GAUZE KITTNER 4X5 RF (MISCELLANEOUS) ×6 IMPLANT
GAUZE SPONGE 4X4 12PLY STRL (GAUZE/BANDAGES/DRESSINGS) ×2 IMPLANT
GLOVE SURG SS PI 8.0 STRL IVOR (GLOVE) ×2 IMPLANT
GLOVE SURG SYN 7.5  E (GLOVE)
GLOVE SURG SYN 7.5 E (GLOVE) IMPLANT
GLOVE SURG SYN 7.5 PF PI (GLOVE) IMPLANT
GLOVE TRIUMPH SURG SIZE 7.5 (KITS) ×2 IMPLANT
GOWN STRL REUS W/ TWL LRG LVL3 (GOWN DISPOSABLE) ×2 IMPLANT
GOWN STRL REUS W/ TWL XL LVL3 (GOWN DISPOSABLE) ×3 IMPLANT
GOWN STRL REUS W/TWL 2XL LVL3 (GOWN DISPOSABLE) ×2 IMPLANT
GOWN STRL REUS W/TWL LRG LVL3 (GOWN DISPOSABLE) ×4
GOWN STRL REUS W/TWL XL LVL3 (GOWN DISPOSABLE) ×6
HEMOSTAT SURGICEL 2X14 (HEMOSTASIS) ×6 IMPLANT
IRRIGATION STRYKERFLOW (MISCELLANEOUS) ×1 IMPLANT
IRRIGATOR STRYKERFLOW (MISCELLANEOUS) ×2
KIT BASIN OR (CUSTOM PROCEDURE TRAY) ×2 IMPLANT
KIT SUCTION CATH 14FR (SUCTIONS) IMPLANT
KIT TURNOVER KIT B (KITS) ×2 IMPLANT
LOOP VESSEL SUPERMAXI WHITE (MISCELLANEOUS) IMPLANT
NDL HYPO 25GX1X1/2 BEV (NEEDLE) ×1 IMPLANT
NDL SPNL 22GX3.5 QUINCKE BK (NEEDLE) ×1 IMPLANT
NEEDLE HYPO 25GX1X1/2 BEV (NEEDLE) ×2 IMPLANT
NEEDLE SPNL 22GX3.5 QUINCKE BK (NEEDLE) ×2 IMPLANT
NS IRRIG 1000ML POUR BTL (IV SOLUTION) ×4 IMPLANT
PACK CHEST (CUSTOM PROCEDURE TRAY) ×2 IMPLANT
PAD ARMBOARD 7.5X6 YLW CONV (MISCELLANEOUS) ×4 IMPLANT
PORT ACCESS TROCAR AIRSEAL 12 (TROCAR) ×1 IMPLANT
PORT ACCESS TROCAR AIRSEAL 5M (TROCAR) ×1
RELOAD STAPLE 45 2.5 WHT DVNC (STAPLE) IMPLANT
RELOAD STAPLE 45 3.5 BLU DVNC (STAPLE) IMPLANT
RELOAD STAPLE 45 4.3 GRN DVNC (STAPLE) IMPLANT
RELOAD STAPLER 2.5X45 WHT DVNC (STAPLE) ×7 IMPLANT
RELOAD STAPLER 3.5X45 BLU DVNC (STAPLE) ×3 IMPLANT
RELOAD STAPLER 4.3X45 GRN DVNC (STAPLE) ×1 IMPLANT
SCISSORS LAP 5X35 DISP (ENDOMECHANICALS) IMPLANT
SEAL CANN UNIV 5-8 DVNC XI (MISCELLANEOUS) ×2 IMPLANT
SEAL XI 5MM-8MM UNIVERSAL (MISCELLANEOUS) ×4
SEALANT PROGEL (MISCELLANEOUS) IMPLANT
SEALANT SURG COSEAL 4ML (VASCULAR PRODUCTS) IMPLANT
SEALANT SURG COSEAL 8ML (VASCULAR PRODUCTS) IMPLANT
SEALER SYNCHRO 8 IS4000 DV (MISCELLANEOUS) ×2
SEALER SYNCHRO 8 IS4000 DVNC (MISCELLANEOUS) IMPLANT
SET TRI-LUMEN FLTR TB AIRSEAL (TUBING) ×2 IMPLANT
SHEARS HARMONIC HDI 20CM (ELECTROSURGICAL) IMPLANT
SOLUTION ELECTROLUBE (MISCELLANEOUS) ×2 IMPLANT
SPONGE INTESTINAL PEANUT (DISPOSABLE) ×1 IMPLANT
SPONGE TONSIL TAPE 1 RFD (DISPOSABLE) IMPLANT
STAPLER 45 SUREFORM CVD (STAPLE) ×2
STAPLER 45 SUREFORM CVD DVNC (STAPLE) IMPLANT
STAPLER CANNULA SEAL DVNC XI (STAPLE) ×2 IMPLANT
STAPLER CANNULA SEAL XI (STAPLE) ×6
STAPLER RELOAD 2.5X45 WHITE (STAPLE) ×14
STAPLER RELOAD 2.5X45 WHT DVNC (STAPLE) ×7
STAPLER RELOAD 3.5X45 BLU DVNC (STAPLE) ×3
STAPLER RELOAD 3.5X45 BLUE (STAPLE) ×6
STAPLER RELOAD 4.3X45 GREEN (STAPLE) ×2
STAPLER RELOAD 4.3X45 GRN DVNC (STAPLE) ×1
SUT PROLENE 4 0 RB 1 (SUTURE) ×2
SUT PROLENE 4-0 RB1 .5 CRCL 36 (SUTURE) IMPLANT
SUT SILK  1 MH (SUTURE) ×2
SUT SILK 1 MH (SUTURE) ×1 IMPLANT
SUT SILK 1 TIES 10X30 (SUTURE) ×2 IMPLANT
SUT SILK 2 0 SH (SUTURE) ×2 IMPLANT
SUT SILK 2 0SH CR/8 30 (SUTURE) ×1 IMPLANT
SUT SILK 3 0SH CR/8 30 (SUTURE) IMPLANT
SUT VIC AB 1 CTX 36 (SUTURE) ×2
SUT VIC AB 1 CTX36XBRD ANBCTR (SUTURE) ×1 IMPLANT
SUT VIC AB 2-0 CTX 36 (SUTURE) ×2 IMPLANT
SUT VIC AB 3-0 MH 27 (SUTURE) IMPLANT
SUT VIC AB 3-0 X1 27 (SUTURE) ×3 IMPLANT
SUT VICRYL 0 TIES 12 18 (SUTURE) ×2 IMPLANT
SUT VICRYL 0 UR6 27IN ABS (SUTURE) ×4 IMPLANT
SUT VICRYL 2 TP 1 (SUTURE) IMPLANT
SYR 20CC LL (SYRINGE) ×4 IMPLANT
SYSTEM RETRIEVAL ANCHOR 12 (MISCELLANEOUS) ×1 IMPLANT
SYSTEM SAHARA CHEST DRAIN ATS (WOUND CARE) ×2 IMPLANT
TAPE CLOTH 4X10 WHT NS (GAUZE/BANDAGES/DRESSINGS) ×2 IMPLANT
TAPE CLOTH SURG 4X10 WHT LF (GAUZE/BANDAGES/DRESSINGS) ×1 IMPLANT
TIP APPLICATOR SPRAY EXTEND 16 (VASCULAR PRODUCTS) IMPLANT
TOWEL GREEN STERILE (TOWEL DISPOSABLE) ×2 IMPLANT
TRAY FOLEY MTR SLVR 16FR STAT (SET/KITS/TRAYS/PACK) ×2 IMPLANT
WATER STERILE IRR 1000ML POUR (IV SOLUTION) ×2 IMPLANT

## 2020-10-01 NOTE — Progress Notes (Signed)
ABO/Rh sample sent to blood bank.

## 2020-10-01 NOTE — Transfer of Care (Signed)
Immediate Anesthesia Transfer of Care Note  Patient: Taylor Bailey  Procedure(s) Performed: XI ROBOTIC ASSISTED THORASCOPY-LEFT UPPER LOBECTOMY, possible thoracotomy (Left Chest) INTERCOSTAL NERVE BLOCK (Left Chest) NODE DISSECTION (Left Chest)  Patient Location: PACU  Anesthesia Type:General  Level of Consciousness: awake, alert , oriented and patient cooperative  Airway & Oxygen Therapy: Patient Spontanous Breathing  Post-op Assessment: Report given to RN, Post -op Vital signs reviewed and stable and Patient moving all extremities  Post vital signs: Reviewed and stable  Last Vitals:  Vitals Value Taken Time  BP 138/72 10/01/20 1653  Temp    Pulse 91 10/01/20 1654  Resp 14 10/01/20 1654  SpO2 100 % 10/01/20 1654  Vitals shown include unvalidated device data.  Last Pain:  Vitals:   10/01/20 1046  TempSrc:   PainSc: 0-No pain      Patients Stated Pain Goal: 4 (61/47/09 2957)  Complications: No complications documented.

## 2020-10-01 NOTE — Brief Op Note (Incomplete Revision)
10/01/2020  4:35 PM  PATIENT:  Taylor Bailey  75 y.o. female  PRE-OPERATIVE DIAGNOSIS:  Left Upper Lobe CARCINOID TUMOR  POST-OPERATIVE DIAGNOSIS:  Left Upper Lobe CARCINOID TUMOR  PROCEDURE:  -XI ROBOTIC-ASSISTED THORASCOPY-LEFT UPPER LOBECTOMY  -INTERCOSTAL NERVE BLOCK   -NODE DISSECTION (Left)  SURGEON:  Melrose Nakayama, MD - Primary  PHYSICIAN ASSISTANT: Enid Cutter, PA-C  ANESTHESIA:   general  EBL:  175 mL   BLOOD ADMINISTERED:none  DRAINS: 61fr Blake drain left pleural space   LOCAL MEDICATIONS USED:  Exparel  SPECIMEN:  Source of Specimen:  Left upper lung lobe, multiple mediastinal lumpjh nodes  DISPOSITION OF SPECIMEN:  PATHOLOGY  COUNTS:  YES  DICTATION: .Dragon Dictation  PLAN OF CARE: Admit to inpatient   PATIENT DISPOSITION:  PACU - hemodynamically stable.   Delay start of Pharmacological VTE agent (>24hrs) due to surgical blood loss or risk of bleeding: no

## 2020-10-01 NOTE — Anesthesia Procedure Notes (Signed)
Arterial Line Insertion Start/End1/20/2022 11:15 AM, 10/01/2020 11:30 AM Performed by: Renato Shin, CRNA, CRNA  Patient location: Pre-op. Preanesthetic checklist: patient identified, IV checked, site marked, risks and benefits discussed, surgical consent, monitors and equipment checked, pre-op evaluation, timeout performed and anesthesia consent Lidocaine 1% used for infiltration Right, radial was placed Catheter size: 20 G Hand hygiene performed  and maximum sterile barriers used   Attempts: 2 Procedure performed without using ultrasound guided technique. Following insertion, dressing applied and Biopatch. Post procedure assessment: normal and unchanged

## 2020-10-01 NOTE — Anesthesia Procedure Notes (Signed)
Procedure Name: Intubation Date/Time: 10/01/2020 12:44 PM Performed by: Reece Agar, CRNA Pre-anesthesia Checklist: Patient identified, Emergency Drugs available, Suction available and Patient being monitored Patient Re-evaluated:Patient Re-evaluated prior to induction Oxygen Delivery Method: Circle System Utilized Preoxygenation: Pre-oxygenation with 100% oxygen Induction Type: IV induction Ventilation: Mask ventilation without difficulty Laryngoscope Size: Glidescope and 3 Grade View: Grade I Tube type: Oral Endobronchial tube: Left, Double lumen EBT, EBT position confirmed by auscultation and EBT position confirmed by fiberoptic bronchoscope Number of attempts: 1 Airway Equipment and Method: Stylet Placement Confirmation: ETT inserted through vocal cords under direct vision,  positive ETCO2 and breath sounds checked- equal and bilateral Secured at: 31 cm Tube secured with: Tape Dental Injury: Teeth and Oropharynx as per pre-operative assessment

## 2020-10-01 NOTE — Brief Op Note (Addendum)
10/01/2020  4:35 PM  PATIENT:  Taylor Bailey  75 y.o. female  PRE-OPERATIVE DIAGNOSIS:  Left Upper Lobe CARCINOID TUMOR  POST-OPERATIVE DIAGNOSIS:  Left Upper Lobe CARCINOID TUMOR  PROCEDURE:  -XI ROBOTIC-ASSISTED LEFT THORASCOPY- -LEFT UPPER LOBECTOMY -INTERCOSTAL NERVE BLOCK -NODE DISSECTION (Left)  SURGEON:  Melrose Nakayama, MD - Primary  PHYSICIAN ASSISTANT: Enid Cutter, PA-C  ANESTHESIA:   general  EBL:  175 mL   BLOOD ADMINISTERED:none  DRAINS: 65fr Blake drain left pleural space   LOCAL MEDICATIONS USED:  Exparel  SPECIMEN:  Source of Specimen:  Left upper lung lobe, multiple mediastinal lumpjh nodes  DISPOSITION OF SPECIMEN:  PATHOLOGY  COUNTS:  YES  DICTATION: .Dragon Dictation  PLAN OF CARE: Admit to inpatient   PATIENT DISPOSITION:  PACU - hemodynamically stable.   Delay start of Pharmacological VTE agent (>24hrs) due to surgical blood loss or risk of bleeding: no

## 2020-10-01 NOTE — Interval H&P Note (Signed)
History and Physical Interval Note:  10/01/2020 11:34 AM  Taylor Bailey  has presented today for surgery, with the diagnosis of LUL CARCINOID TUMOR.  The various methods of treatment have been discussed with the patient and family. After consideration of risks, benefits and other options for treatment, the patient has consented to  Procedure(s): XI ROBOTIC ASSISTED THORASCOPY-LEFT UPPER LOBECTOMY, possible thoracotomy (Left) as a surgical intervention.  The patient's history has been reviewed, patient examined, no change in status, stable for surgery.  I have reviewed the patient's chart and labs.  Questions were answered to the patient's satisfaction.     Melrose Nakayama

## 2020-10-01 NOTE — Anesthesia Postprocedure Evaluation (Signed)
Anesthesia Post Note  Patient: Taylor Bailey  Procedure(s) Performed: XI ROBOTIC ASSISTED THORASCOPY-LEFT UPPER LOBECTOMY, possible thoracotomy (Left Chest) INTERCOSTAL NERVE BLOCK (Left Chest) NODE DISSECTION (Left Chest)     Patient location during evaluation: PACU Anesthesia Type: General Level of consciousness: awake Pain management: pain level controlled Vital Signs Assessment: post-procedure vital signs reviewed and stable Respiratory status: spontaneous breathing, nonlabored ventilation, respiratory function stable and patient connected to nasal cannula oxygen Cardiovascular status: blood pressure returned to baseline and stable Postop Assessment: no apparent nausea or vomiting Anesthetic complications: no   No complications documented.  Last Vitals:  Vitals:   10/01/20 1737 10/01/20 1922  BP: 133/77 123/85  Pulse:  88  Resp: 20 17  Temp: (!) 36.4 C 36.6 C  SpO2: 99% 94%    Last Pain:  Vitals:   10/01/20 1922  TempSrc: Oral  PainSc:                  [name]

## 2020-10-02 ENCOUNTER — Inpatient Hospital Stay (HOSPITAL_COMMUNITY): Payer: Medicare Other

## 2020-10-02 ENCOUNTER — Encounter (HOSPITAL_COMMUNITY): Payer: Self-pay | Admitting: Thoracic Surgery (Cardiothoracic Vascular Surgery)

## 2020-10-02 LAB — CBC
HCT: 30.8 % — ABNORMAL LOW (ref 36.0–46.0)
Hemoglobin: 10.4 g/dL — ABNORMAL LOW (ref 12.0–15.0)
MCH: 29.1 pg (ref 26.0–34.0)
MCHC: 33.8 g/dL (ref 30.0–36.0)
MCV: 86 fL (ref 80.0–100.0)
Platelets: 209 10*3/uL (ref 150–400)
RBC: 3.58 MIL/uL — ABNORMAL LOW (ref 3.87–5.11)
RDW: 13.9 % (ref 11.5–15.5)
WBC: 12.8 10*3/uL — ABNORMAL HIGH (ref 4.0–10.5)
nRBC: 0 % (ref 0.0–0.2)

## 2020-10-02 LAB — BASIC METABOLIC PANEL
Anion gap: 10 (ref 5–15)
BUN: 8 mg/dL (ref 8–23)
CO2: 20 mmol/L — ABNORMAL LOW (ref 22–32)
Calcium: 8.3 mg/dL — ABNORMAL LOW (ref 8.9–10.3)
Chloride: 100 mmol/L (ref 98–111)
Creatinine, Ser: 0.69 mg/dL (ref 0.44–1.00)
GFR, Estimated: >60 mL/min (ref >60)
Glucose, Bld: 222 mg/dL — ABNORMAL HIGH (ref 70–99)
Potassium: 4.2 mmol/L (ref 3.5–5.1)
Sodium: 130 mmol/L — ABNORMAL LOW (ref 135–145)

## 2020-10-02 MED ORDER — LACTATED RINGERS IV SOLN: INTRAVENOUS | Status: DC

## 2020-10-02 MED ORDER — FLUTICASONE PROPIONATE 50 MCG/ACT NA SUSP: 2.0000 | Freq: Every day | NASAL | Status: DC | PRN

## 2020-10-02 NOTE — Progress Notes (Signed)
1 Day Post-Op Procedure(s) (LRB): XI ROBOTIC ASSISTED THORASCOPY-LEFT UPPER LOBECTOMY, possible thoracotomy (Left) INTERCOSTAL NERVE BLOCK (Left) NODE DISSECTION (Left) Subjective: Pain well controlled  Objective: Vital signs in last 24 hours: Temp:  [97.5 F (36.4 C)-98.7 F (37.1 C)] 98.7 F (37.1 C) (01/21 0332) Pulse Rate:  [69-99] 94 (01/21 0500) Cardiac Rhythm: Sinus tachycardia;Heart block (01/21 0711) Resp:  [14-20] 20 (01/21 0500) BP: (99-141)/(52-85) 115/52 (01/21 0500) SpO2:  [94 %-100 %] 95 % (01/21 0500) Weight:  [71.4 kg] 71.4 kg (01/20 1020)  Hemodynamic parameters for last 24 hours:    Intake/Output from previous day: 01/20 0701 - 01/21 0700 In: 2873.8 [P.O.:220; I.V.:2303.8; IV Piggyback:350] Out: 4098 [Urine:1260; Blood:175; Chest Tube:116] Intake/Output this shift: No intake/output data recorded.  General appearance: alert, cooperative and no distress Neurologic: intact Heart: regular rate and rhythm Lungs: diminished breath sounds left base no air leak  Lab Results: Recent Labs    09/29/20 1053 10/02/20 0040  WBC 5.9 12.8*  HGB 13.0 10.4*  HCT 39.5 30.8*  PLT 276 209   BMET:  Recent Labs    09/29/20 1053 10/02/20 0040  NA 136 130*  K 4.0 4.2  CL 105 100  CO2 21* 20*  GLUCOSE 97 222*  BUN 11 8  CREATININE 0.58 0.69  CALCIUM 9.3 8.3*    PT/INR:  Recent Labs    09/29/20 1053  LABPROT 11.9  INR 0.9   ABG    Component Value Date/Time   PHART 7.422 09/29/2020 1106   HCO3 23.1 09/29/2020 1106   ACIDBASEDEF 0.7 09/29/2020 1106   O2SAT 97.0 09/29/2020 1106   CBG (last 3)  No results for input(s): GLUCAP in the last 72 hours.  Assessment/Plan: S/P Procedure(s) (LRB): XI ROBOTIC ASSISTED THORASCOPY-LEFT UPPER LOBECTOMY, possible thoracotomy (Left) INTERCOSTAL NERVE BLOCK (Left) NODE DISSECTION (Left) POD # 1  Looks great Pain well controlled No air leak but has more SQ emphysema and space larger- will place tube to suction  and see if it makes any difference. Tube was not kinked when I examined her but could have been earlier. Ambulate SCD + enoxaparin Tolerating regular diet, appetite not great    LOS: 1 day    Melrose Nakayama 10/02/2020

## 2020-10-02 NOTE — Discharge Summary (Addendum)
Physician Discharge Summary  Patient ID: Taylor Bailey MRN: [id number] DOB/AGE: 74/04/1946 74 y.o.  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  She recently had a CT for coronary calcium scoring.  Her coronary calcium score was 0.  However, she was incidentally found to have a left upper lobe mass.  CT and PET/CT confirmed a left upper lobe mass with likely postobstructive adenopathy.  Biopsy showed a typical carcinoid tumor.  The findings on CT and PET are consistent with either central lesion with postobstructive atelectasis and pneumonitis.  Is also possible there is a more peripheral T1 lesion with obstruction and then more central N1 adenopathy.  The degree of hypermetabolism is pretty impressive.  Overall the findings are very impressive for a typical carcinoid tumor.  In any event the treatment of choice is surgical resection.  This will require a left upper lobectomy.  The plan will be to do this robotically.  She does understand there is a possibility of needing to convert to thoracotomy.  I described the proposed procedure of robotic left upper lobectomy to [name].  I informed her of the general nature of the procedure including the need for general anesthesia, the incisions to be used, the use of a drainage tube postoperatively, the expected hospital stay, and the overall recovery.  I informed her of the indications, risks, benefits, and alternatives.  She understands the risks include, but are not limited to death, MI, DVT, PE, bleeding, possible need for transfusion, infection, prolonged air  leak, cardiac arrhythmias, conversion to thoracotomy, as well as the possibility of other unforeseeable complications.  She understands and accepts the risk and agrees to proceed.   Hospital Course:  [name] was admitted for elective surgery on 10/01/2020.  She was prepared and taken to the operating room where robotic-assisted left upper lobectomy was carried out along with left intercostal nerve block using Exparel  and mediastinal lymph node dissection.  The specimen was sent for frozen section intraoperatively and showed negative margins on the bronchial stump.  The patient was extubated and initially recovered in the postanesthesia care unit and later transferred to [facility] progressive care.  Respiratory status remained stable.  She did not have an air leak but had some subcutaneous emphysema on physical exam on postop day 1.  For this reason, the chest tube was placed on suction after being on waterseal overnight following surgery.  She was mobilized early postoperatively.  Pain was controlled adequately with a combination of IV Toradol for the first 48 hours with supplemental tramadol and OxyContin by mouth as needed. She did not have an active air leak visible in the PleurEvac but she developed left chest subQ air. The chest tube was placed on suction on POD2 with some improvement in the CXR.  The chest tube was placed back to water seal and the CXR remained stable. The chest tube was removed on POD3. F/U CXR showed no pneumothorax and stable left-sided subQ air. She was maintaining adequate O2 saturation on RA, tolerating a diet, and ambulating independently at the time of discharge.   Consults: None  Significant Diagnostic Studies:  CLINICAL DATA:  Initial treatment strategy for chest CT demonstrating findings suspicious for central left upper lobe primary bronchogenic carcinoma.  EXAM: NUCLEAR MEDICINE PET SKULL BASE TO THIGH  TECHNIQUE: 7.8 mCi F-18 FDG was injected  intravenously. Full-ring PET imaging was performed from the skull base to thigh after the radiotracer. CT data was obtained and used for attenuation correction and anatomic localization.  Fasting blood glucose: 87 mg/dl  COMPARISON:  Chest CT of 08/19/2020.  FINDINGS: Mediastinal blood pool activity: SUV max 3.1  Liver activity: SUV max NA  NECK: No areas of abnormal hypermetabolism.  Incidental CT findings: Bilateral carotid atherosclerosis. No cervical adenopathy.  CHEST: Hypermetabolism corresponding to the obstructive central left upper lobe lung lesion versus less likely suprahilar lymph node. On the order of 2.3 x 2.8 cm and a S.U.V. max of 12.3 on 64/4.  Within the area of surrounding left upper lobe collapse/consolidation, there is heterogeneous multifocal hypermetabolism, including at a S.U.V. max of 8.1.  Incidental CT findings: Deferred to recent diagnostic CT. No acute superimposed process.  ABDOMEN/PELVIS: No abdominopelvic nodal hypermetabolism. Low anal hypermetabolism measures a S.U.V. max of 7.3 and is without CT correlate.  Incidental CT findings: Normal adrenal glands. Abdominal aortic atherosclerosis. Mild pelvic floor laxity.  SKELETON: No abnormal marrow activity.  Incidental CT findings: none  IMPRESSION: 1. Left upper lobe primary bronchogenic carcinoma. An obstructive central left upper lobe hypermetabolic lesion is favored to represent a primary neoplasm versus less likely metastatic node. Hypermetabolism within the collapsed left upper lobe may simply represent postobstructive pneumonitis versus pulmonary primary/metastasis. If not already performed, recommend multidisciplinary thoracic oncology consultation for eventual sampling via bronchoscopy. 2. No hypermetabolic extrathoracic metastasis. 3. Anal hypermetabolism is most likely physiologic. This could be correlated with physical exam.   Electronically Signed   By:  Abigail Miyamoto M.D.   On: 09/03/2020 14:14    CLINICAL DATA:  Persistent cough, lung nodule.  EXAM: CT CHEST WITH CONTRAST  TECHNIQUE: Multidetector CT imaging of the chest was performed during intravenous contrast administration.  CONTRAST:  59mL OMNIPAQUE IOHEXOL 300 MG/ML  SOLN  COMPARISON:  July 31, 2020.  September 25, 2012.  FINDINGS: Cardiovascular: Atherosclerosis of thoracic aorta is noted without aneurysm or dissection. Mild cardiomegaly is noted. No pericardial effusion is noted.  Mediastinum/Nodes: Small sliding-type hiatal hernia is noted. Thyroid gland is unremarkable. Probable left hilar mass measuring 2.7 x 2.3 cm is noted concerning for metastatic disease or adenopathy.  Lungs/Pleura: No pneumothorax or pleural effusion is noted. 5 mm sub solid nodule seen in right lower lobe best seen on image number 115 of series 4. Large area of consolidation is seen involving the left upper lobe and suprahilar region most consistent with postobstructive atelectasis secondary to endobronchial lesion or neoplasm. Irregular density measuring 4.0 x 1.5 cm is noted posteriorly in the left upper lobe which may represent inflammation, although neoplasm must be considered.  Upper Abdomen: No acute abnormality.  Musculoskeletal: No chest wall abnormality. No acute or significant osseous findings.  IMPRESSION: 1. Probable left hilar mass is noted measuring 2.7 x 2.3 cm concerning for metastatic disease or adenopathy. 2. Large area of consolidation is seen involving the left upper lobe and suprahilar region most consistent with postobstructive atelectasis secondary to endobronchial lesion or neoplasm. Irregular density is noted posteriorly in the left upper lobe which may represent inflammation, although neoplasm must be considered. Bronchoscopy is recommended for further evaluation. These results will be called to the ordering clinician or representative by  the  Psychologist, clinical, and communication documented in the PACS or zVision Dashboard. 3. 5 mm sub solid nodule is noted in right lower lobe. Initial follow-up by chest CT without contrast is recommended in 3 months to confirm persistence. This recommendation follows the consensus statement: Recommendations for the Management of Subsolid Pulmonary Nodules Detected at CT: A Statement from the [place] as published in Radiology 2013; 266:304-317. 4. Small sliding-type hiatal hernia. 5. Aortic atherosclerosis.  Aortic Atherosclerosis (ICD10-I70.0).   Electronically Signed   By: Marijo Conception M.D.   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  PROCEDURE:  Xi robotic-assisted left thoracoscopy, left upper lobectomy, lymph node dissection, intercostal nerve block levels 3 through 10.  SURGEON:  Modesto Charon, MD  ASSISTANT:  Enid Cutter, PA.  ANESTHESIA:  General.  FINDINGS:  Multiple enlarged nodes, bronchial margin negative for tumor.  Adhesions to the apex and anterior mediastinum.  CLINICAL NOTE:  This is a 75 year old woman who recently was found to have a left upper lobe lung mass on a CT that was done to evaluate for coronary calcium score.  This was felt to represent a 2.7 x 2.3 cm left hilar mass with a large area of  postobstructive atelectasis and consolidation.  On PET CT, there was hypermetabolism both at the primary lesion as well as in the lung.  It was unclear if this was a more peripheral tumor with central adenopathy or central tumor with postobstructive  atelectasis.  Dr. Valeta Harms performed bronchoscopy and biopsies were positive for typical carcinoid tumor.  She was advised to undergo surgical resection.  The indications, risks, benefits, and  alternatives were discussed in detail with the patient.  She  understood and accepted the risks and agreed to proceed.  Discharge Exam: Blood pressure 124/67, pulse 77, temperature 98.5 F (36.9 C), temperature source Oral, resp. rate 20, height 5' 5.5" (1.664 m), weight 71.4 kg, SpO2 98 %.  General appearance: alert, cooperative and no distress Neurologic: intact Heart: mildly tachy, regular Lungs: diminished breath sounds on left Wound: mild serous draiange from CT site  Disposition:    Allergies as of 10/05/2020      Reactions   Fluoride Preparations    FLU VACCINE   Influenza Vaccines    Hives, angioedema- monitored in office on benadryl (received at [facility])    Prednisone    Bad shakes/hyper after 2 days. Not sure about injections       Medication List    TAKE these medications   fluticasone 50 MCG/ACT nasal spray Commonly known as: FLONASE Place 2 sprays into both nostrils daily as needed for rhinitis.   oxyCODONE-acetaminophen 5-325 MG tablet Commonly known as: Percocet Take 1 tablet by mouth every 4 (four) hours as needed for up to 5 days for severe pain.   Vitamin D 50 MCG (2000 UT) Caps Take 2,000 Units by mouth daily. Gummie       Follow-up Information    Melrose Nakayama, MD. Go on 10/27/2020.   Specialty: Cardiothoracic Surgery Why: Your appointment is on Tuesday, 10/27/2020 at 1:45 PM with Dr. Roxan Hockey.  Please arrive 30 minutes early for chest x-ray to be performed by [facility] located on the 1st floor of the same Medical laboratory scientific officer information: [address] [address] [phone]  Signed: Antony Odea, PA-C 10/05/2020, 8:29 AM

## 2020-10-02 NOTE — Op Note (Signed)
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  PROCEDURE:   Xi robotic-assisted left thoracoscopy, Left upper lobectomy, Lymph node dissection, Intercostal nerve block levels 3 through 10.  SURGEON:  Modesto Charon, MD  ASSISTANT:  Enid Cutter, PA.  ANESTHESIA:  General.  FINDINGS:  Multiple enlarged nodes. Bronchial margin negative for tumor.  Adhesions to the apex and anterior mediastinum.  CLINICAL NOTE:  Mrs. Felmlee a 75 year old woman who recently was found to have a left upper lobe lung mass on a CT that was done to evaluate for coronary calcium score.  This was felt to represent a 2.7 x 2.3 cm left hilar mass with a large area of  postobstructive atelectasis and consolidation.  On PET CT, there was hypermetabolism both at the primary lesion as well as in the lung.  It was unclear if this was a more peripheral tumor with central adenopathy or central tumor with postobstructive  atelectasis.  Dr. Valeta Harms performed bronchoscopy and biopsies were positive for typical carcinoid tumor.  She was advised to undergo surgical resection.  The indications, risks, benefits, and alternatives were discussed in detail with the patient.  She  understood and accepted the risks and agreed to proceed.  OPERATIVE NOTE:  Mrs. Tomasso was brought to the preoperative holding area on 10/01/2020.  Anesthesia placed an arterial blood pressure monitoring line and established adequate venous access.  She was taken to the operating room, anesthetized and  intubated with a double lumen endotracheal tube.  Intravenous antibiotics were administered.  A Foley catheter was placed.   Sequential compression devices were placed on the calves for DVT prophylaxis.  She was placed in a right lateral decubitus  position and the left chest was prepped and draped in the usual sterile fashion.  An active warming blanket was in place.  Single lung ventilation of the right lung was initiated and was tolerated well throughout the procedure.  A timeout was performed.  A solution containing 20 mL of liposomal bupivacaine, 30 mL of 0.5% bupivacaine and 50 mL of saline was prepared.  This was used for local at the incision sites as well as for the intercostal nerve blocks.  An incision was made  in the eighth interspace in the mid axillary line, and an 8 mm robotic port was inserted.  The scope was advanced into the chest.  There was good isolation of the left lung.  Carbon dioxide was insufflated per protocol.  A 12 mm port was placed in the 8th  interspace, 5 cm anterior to the camera port.  Intercostal nerve blocks then were performed from the 3rd to the 10th interspace.  A needle was inserted from a posterior approach and 10 mL of the bupivacaine solution was injected into a subpleural plane  at each level.  An additional 12 mm port was placed in the 8th interspace 5 cm posterior to the camera and then an 8 mm port was placed posterior to that for the retraction arm.  An assistant's port was placed in the 10th interspace posterolaterally.  The  AirSeal then was connected to the assistant port.  The robotic instruments were inserted with thoracoscopic visualization.  Initially on placing the scope, it was noted that there were adhesions to the lateral  chest wall.  These were taken down.  They were very thin filmy adhesions.  Then, additional adhesions were noted superiorly near the apex.  These were thicker and more  vascular adhesions.  They were taken down with bipolar cautery.  There also were adhesions to the mediastinum.  These did not appear malignant in nature, but more inflammatory.  The  phrenic nerve was identified and preserved as the adhesions were taken  down.  Once the adhesions had been taken down the lower lobe was retracted superiorly.  The inferior pulmonary ligament was divided.  All lymph nodes that were encountered during the dissection were removed and sent for permanent pathology.  No level 9  node was present, but the level 8 node was removed and sent for pathology.  The pleural reflection was divided at the hilum posteriorly and level 7 nodes were removed and sent as well as a level 10 node.  The pulmonary artery was dissected out  posteriorly.  The dissection then was carried more superiorly.  A level 6 node was removed and then a rather large level 5 node was removed.  Although enlarged, it was otherwise benign in appearance.  The lung then was retracted posteriorly and the  superior pulmonary vein was dissected out.  A level 10 node was removed from that area as well.  Completion of the fissure then was begun.  This was done from an anterior to posterior approach using the robotic stapler.  Once the anterior part of the  fissure had been completed, level 11 nodes were dissected out.  The remainder of the fissure then was completed, the dissection then was carried out along the pulmonary artery.  There was a small branch arising posteriorly, which was dissected out and  divided with the stapler using a vascular cartridge.  Next, attention was turned back anteriorly and the superior pulmonary vein. Once the dissection of that was completed, it was encircled and when doing so, there was a small tear in the posterior aspect of  the vein, which resulted in a small amount of bleeding.  A vessel loop was placed around the vein and with traction the bleeding improved.  This vessel then was stapled using the robotic stapler and there was no additional bleeding.  The lingular PA branches  then were encircled and divided with the stapler.  Inspecting posteriorly and superiorly there  were 3 separate relatively large pulmonary artery branches arising from the PA.  There was an enlarged, but otherwise benign appearing node around these  vessels.  The vessels were dissected out.  The decision was made to divide each of these vessels separately and each was divided with the stapler.  At this point, the robotic stapler with a green cartridge was placed across the upper lobe bronchus and  closed.  A test inflation showed good aeration of the lower lobe.  The stapler then was fired transecting the bronchus and completing the lobectomy.  Additional nodes were then removed from beyond the bifurcation along the bronchus and sent for permanent  pathology.  The upper lobe was placed into an endoscopic retrieval bag and brought down to the inferior aspect of the chest.  The robotic instruments were removed.  The robot was undocked.  The sponges and vessel loop that were used during the  dissection were removed.  The anterior 8th interspace incision was enlarged and the upper lobe was removed through that incision.  The upper lobe did tear as it was removed.  The  specimen was sent for pathology for frozen section of the bronchial margin.   The chest was copiously irrigated with warm saline.  A test inflation to 30 cm water revealed no leakage from the bronchial stump and good reexpansion of the lower lobe.  A 28-French chest tube was placed through the original port incision and secured  with a #1 silk suture.  Dual-lung ventilation was resumed.  The frozen section of the bronchial margin returned with no tumor seen.  The anterior incision was closed with #1 Vicryl fascial suture and a 3-0 Vicryl subcuticular suture.  The remaining port  incisions were closed with 0 Vicryl fascial sutures and a 3-0 Vicryl subcuticular sutures.  Dermabond was applied.  The chest tube was placed to a Pleur-evac on waterseal.  The patient was extubated in the operating room and taken to the [facility] in  good condition.  HN/NUANCE  D:10/01/2020 T:10/02/2020 JOB:[id number]

## 2020-10-02 NOTE — Discharge Instructions (Signed)
Video-Assisted Thoracic Surgery, Care After What can I expect after the procedure? After the procedure, it is common to have:  Some pain and soreness in your chest.  Pain when you breathe in or cough.  Trouble pooping (constipation).  Tiredness.  Trouble sleeping. Follow these instructions at home: Preventing lung infection  Take deep breaths and cough often. This helps clear mucus and opens your lungs. Doing this helps prevent lung infection (pneumonia).  Use an incentive spirometer if told. This tool shows how much you fill your lungs with each breath.  Coughing may hurt less if you try to support your chest. Try one of these when you cough: ? Hold a pillow on your chest. ? Place both hands flat on your cut or cuts from surgery (incisions).  Do not smoke or use any products that contain nicotine or tobacco. If you need help quitting, ask your doctor.  Stay away when people are smoking (avoid secondhand smoke).    Medicines  Take over-the-counter and prescription medicines only as told by your doctor.  If you have pain, take pain medicine before your pain gets very bad. This is important. Doing this will help you breathe and cough more comfortably.  If you were prescribed an antibiotic medicine, take it as told by your doctor. Do not stop using the antibiotic even if you start to feel better.  If told, take steps to prevent problems with pooping (constipation). You may need to: ? Drink enough fluid to keep your pee (urine) pale yellow. ? Take medicines. You will be told what medicines to take. ? Eat foods that are high in fiber. These include beans, whole grains, and fresh fruits and vegetables. ? Limit foods that are high in fat and sugar. These include fried or sweet foods.  Ask your doctor if you should avoid driving or using machines while you are taking your medicine. Bathing  Do not take baths, swim, or use a hot tub.  Ask your doctor about taking showers or  sponge baths. Caring for your incision from surgery  Follow instructions from your doctor about how to take care of your incision. Make sure you: ? Wash your hands with soap and water for at least 20 seconds before and after you change your bandage. If you cannot use soap and water, use hand sanitizer. ? Change your bandage as told. ? Leave stitches, skin glue, or staples in place for at least 2 weeks. ? Leave tape strips alone unless you are told to take them off. You may trim the edges of the tape strips if they curl up.  Keep your bandage dry until it is taken off.  Check your incision every day for signs of infection. Check for: ? Redness, swelling, or more pain. ? Fluid or blood. ? Warmth. ? Pus or a bad smell.    Activity  Avoid activities that use your chest muscles for at least 3-4 weeks.  Do not lift anything that is heavier than 10 lb (4.5 kg), or the limit that you are told.  Return to your normal activities when your doctor says that it is safe.  Rest as told by your doctor.  Get up to take short walks every 1 to 2 hours. Ask for help if you feel weak or unsteady.  Do exercises as told by your doctor. General instructions  If you were given a sedative during your procedure, do not drive or use machines until your doctor says that it is safe. A  sedative is a medicine that helps you relax.  If you have a chest tube, care for it as told.  Do not travel by airplane during the 2 weeks after your chest tube is taken out, or until your doctor says that this is safe.  Keep all follow-up visits. Contact a doctor if:  You have any of these signs of infection around an incision: ? Redness, swelling, or more pain. ? Fluid or blood. ? Warmth. ? Pus or a bad smell.  You have a fever or chills.  You feel like you may vomit or you vomit.  Your pain does not get better with medicine. Get help right away if:  You have chest pain.  You have fast or uneven  heartbeats.  You get a rash.  You are short of breath.  You have trouble breathing.  You are mixed up (confused).  You have trouble talking.  You feel weak, light-headed, or dizzy.  You faint. These symptoms may be an emergency. Get help right away. Call your local emergency services (911 in the [place]  Do not wait to see if the symptoms will go away.  Do not drive yourself to the hospital. Summary  Take deep breaths and cough often. This helps clear mucus and opens your lungs. Doing this helps prevent lung infection (pneumonia).  If you have pain, take pain medicine before your pain gets very bad.  Check your incision from surgery every day for signs of infection. Contact a doctor if you have signs of infection.  Return to your normal activities when your doctor says that it is safe. This information is not intended to replace advice given to you by your health care provider. Make sure you discuss any questions you have with your health care provider.

## 2020-10-03 ENCOUNTER — Inpatient Hospital Stay (HOSPITAL_COMMUNITY): Payer: Medicare Other

## 2020-10-03 LAB — COMPREHENSIVE METABOLIC PANEL
ALT: 13 U/L (ref 0–44)
AST: 20 U/L (ref 15–41)
Albumin: 2.7 g/dL — ABNORMAL LOW (ref 3.5–5.0)
Alkaline Phosphatase: 44 U/L (ref 38–126)
Anion gap: 10 (ref 5–15)
BUN: 10 mg/dL (ref 8–23)
CO2: 21 mmol/L — ABNORMAL LOW (ref 22–32)
Calcium: 8.1 mg/dL — ABNORMAL LOW (ref 8.9–10.3)
Chloride: 97 mmol/L — ABNORMAL LOW (ref 98–111)
Creatinine, Ser: 0.53 mg/dL (ref 0.44–1.00)
GFR, Estimated: >60 mL/min (ref >60)
Glucose, Bld: 118 mg/dL — ABNORMAL HIGH (ref 70–99)
Potassium: 3.7 mmol/L (ref 3.5–5.1)
Sodium: 128 mmol/L — ABNORMAL LOW (ref 135–145)
Total Bilirubin: 0.4 mg/dL (ref 0.3–1.2)
Total Protein: 5.2 g/dL — ABNORMAL LOW (ref 6.5–8.1)

## 2020-10-03 LAB — CBC
HCT: 29.4 % — ABNORMAL LOW (ref 36.0–46.0)
Hemoglobin: 10.1 g/dL — ABNORMAL LOW (ref 12.0–15.0)
MCH: 29.1 pg (ref 26.0–34.0)
MCHC: 34.4 g/dL (ref 30.0–36.0)
MCV: 84.7 fL (ref 80.0–100.0)
Platelets: 178 10*3/uL (ref 150–400)
RBC: 3.47 MIL/uL — ABNORMAL LOW (ref 3.87–5.11)
RDW: 13.7 % (ref 11.5–15.5)
WBC: 10.5 10*3/uL (ref 4.0–10.5)
nRBC: 0 % (ref 0.0–0.2)

## 2020-10-03 NOTE — Plan of Care (Signed)
  Problem: Education: Goal: Knowledge of General Education information will improve Description: Including pain rating scale, medication(s)/side effects and non-pharmacologic comfort measures 10/03/2020 2058 by Warren Danes, RN Outcome: Progressing 10/03/2020 2058 by Warren Danes, RN Outcome: Progressing   Problem: Health Behavior/Discharge Planning: Goal: Ability to manage health-related needs will improve 10/03/2020 2058 by Warren Danes, RN Outcome: Progressing 10/03/2020 2058 by Warren Danes, RN Outcome: Progressing   Problem: Clinical Measurements: Goal: Ability to maintain clinical measurements within normal limits will improve 10/03/2020 2058 by Warren Danes, RN Outcome: Progressing 10/03/2020 2058 by Warren Danes, RN Outcome: Progressing Goal: Will remain free from infection 10/03/2020 2058 by Warren Danes, RN Outcome: Progressing 10/03/2020 2058 by Warren Danes, RN Outcome: Progressing Goal: Diagnostic test results will improve 10/03/2020 2058 by Warren Danes, RN Outcome: Progressing 10/03/2020 2058 by Warren Danes, RN Outcome: Progressing Goal: Respiratory complications will improve 10/03/2020 2058 by Warren Danes, RN Outcome: Progressing 10/03/2020 2058 by Warren Danes, RN Outcome: Progressing Goal: Cardiovascular complication will be avoided 10/03/2020 2058 by Warren Danes, RN Outcome: Progressing 10/03/2020 2058 by Warren Danes, RN Outcome: Progressing   Problem: Activity: Goal: Risk for activity intolerance will decrease 10/03/2020 2058 by Warren Danes, RN Outcome: Progressing 10/03/2020 2058 by Warren Danes, RN Outcome: Progressing   Problem: Coping: Goal: Level of anxiety will decrease 10/03/2020 2058 by Warren Danes, RN Outcome: Progressing 10/03/2020 2058 by Warren Danes, RN Outcome: Progressing   Problem: Pain Managment: Goal: General experience of comfort will improve 10/03/2020 2058 by Warren Danes,  RN Outcome: Progressing 10/03/2020 2058 by Warren Danes, RN Outcome: Progressing   Problem: Safety: Goal: Ability to remain free from injury will improve 10/03/2020 2058 by Warren Danes, RN Outcome: Progressing 10/03/2020 2058 by Warren Danes, RN Outcome: Progressing   Problem: Skin Integrity: Goal: Risk for impaired skin integrity will decrease 10/03/2020 2058 by Warren Danes, RN Outcome: Progressing 10/03/2020 2058 by Warren Danes, RN Outcome: Progressing

## 2020-10-03 NOTE — Progress Notes (Signed)
2 Days Post-Op Procedure(s) (LRB): XI ROBOTIC ASSISTED THORASCOPY-LEFT UPPER LOBECTOMY, possible thoracotomy (Left) INTERCOSTAL NERVE BLOCK (Left) NODE DISSECTION (Left) Subjective: Some pain from chest tube, ambulating well, appetite poor  Objective: Vital signs in last 24 hours: Temp:  [97.8 F (36.6 C)-99 F (37.2 C)] 97.8 F (36.6 C) (01/22 0746) Pulse Rate:  [79-97] 79 (01/22 0746) Cardiac Rhythm: Normal sinus rhythm;Bundle branch block (01/22 0813) Resp:  [18-20] 18 (01/22 0746) BP: (124-138)/(56-67) 124/64 (01/22 0746) SpO2:  [94 %-98 %] 98 % (01/22 0746)  Hemodynamic parameters for last 24 hours:    Intake/Output from previous day: 01/21 0701 - 01/22 0700 In: 154.7 [I.V.:154.7] Out: 320 [Chest Tube:320] Intake/Output this shift: Total I/O In: 240 [P.O.:240] Out: 0   General appearance: alert, cooperative and no distress Neurologic: intact Heart: regular rate and rhythm Lungs: diminished breath sounds left base Wound: incisions Ok, ecchymosis left flank no air leak  Lab Results: Recent Labs    10/02/20 0040 10/03/20 0118  WBC 12.8* 10.5  HGB 10.4* 10.1*  HCT 30.8* 29.4*  PLT 209 178   BMET:  Recent Labs    10/02/20 0040 10/03/20 0118  NA 130* 128*  K 4.2 3.7  CL 100 97*  CO2 20* 21*  GLUCOSE 222* 118*  BUN 8 10  CREATININE 0.69 0.53  CALCIUM 8.3* 8.1*    PT/INR: No results for input(s): LABPROT, INR in the last 72 hours. ABG    Component Value Date/Time   PHART 7.422 09/29/2020 1106   HCO3 23.1 09/29/2020 1106   ACIDBASEDEF 0.7 09/29/2020 1106   O2SAT 97.0 09/29/2020 1106   CBG (last 3)  No results for input(s): GLUCAP in the last 72 hours.  Assessment/Plan: S/P Procedure(s) (LRB): XI ROBOTIC ASSISTED THORASCOPY-LEFT UPPER LOBECTOMY, possible thoracotomy (Left) INTERCOSTAL NERVE BLOCK (Left) NODE DISSECTION (Left) -CXR looks better this AM with tube to suction No air leak but cough relatively weak CT to water seal and repeat CXR  at noon Continue ambulation SCD + enoxaparin for DVT prophylaxis   LOS: 2 days    Melrose Nakayama 10/03/2020

## 2020-10-04 ENCOUNTER — Inpatient Hospital Stay (HOSPITAL_COMMUNITY): Payer: Medicare Other

## 2020-10-04 MED ORDER — ACETAMINOPHEN 500 MG PO TABS
500.0000 mg | ORAL_TABLET | Freq: Four times a day (QID) | ORAL | Status: DC | PRN
  Administered 2020-10-04: 500 mg via ORAL
  Filled 2020-10-04: qty 1

## 2020-10-04 MED ORDER — POLYETHYLENE GLYCOL 3350 17 G PO PACK
17.0000 g | PACK | Freq: Every day | ORAL | Status: DC
  Administered 2020-10-04: 17 g via ORAL
  Filled 2020-10-04: qty 1

## 2020-10-04 MED ORDER — ACETAMINOPHEN 500 MG PO TABS: 1000.0000 mg | ORAL_TABLET | Freq: Four times a day (QID) | ORAL | Status: DC | PRN

## 2020-10-04 NOTE — Progress Notes (Signed)
3 Days Post-Op Procedure(s) (LRB): XI ROBOTIC ASSISTED THORASCOPY-LEFT UPPER LOBECTOMY, possible thoracotomy (Left) INTERCOSTAL NERVE BLOCK (Left) NODE DISSECTION (Left) Subjective: No complaints, doesn't feel she needs standing Tylenol  Objective: Vital signs in last 24 hours: Temp:  [97.8 F (36.6 C)-98.5 F (36.9 C)] 98.5 F (36.9 C) (01/23 0726) Pulse Rate:  [73-89] 83 (01/23 0726) Cardiac Rhythm: Normal sinus rhythm (01/22 1919) Resp:  [15-19] 18 (01/23 0726) BP: (100-142)/(58-75) 124/75 (01/23 0726) SpO2:  [94 %-99 %] 97 % (01/23 0726)  Hemodynamic parameters for last 24 hours:    Intake/Output from previous day: 01/22 0701 - 01/23 0700 In: 720 [P.O.:720] Out: 850 [Urine:600; Chest Tube:250] Intake/Output this shift: Total I/O In: 240 [P.O.:240] Out: 320 [Urine:300; Chest Tube:20]  General appearance: alert, cooperative and no distress Neurologic: intact Heart: regular rate and rhythm Lungs: diminished breath sounds left base Wound: clean and dry no air leak with repeated cough  Lab Results: Recent Labs    10/02/20 0040 10/03/20 0118  WBC 12.8* 10.5  HGB 10.4* 10.1*  HCT 30.8* 29.4*  PLT 209 178   BMET:  Recent Labs    10/02/20 0040 10/03/20 0118  NA 130* 128*  K 4.2 3.7  CL 100 97*  CO2 20* 21*  GLUCOSE 222* 118*  BUN 8 10  CREATININE 0.69 0.53  CALCIUM 8.3* 8.1*    PT/INR: No results for input(s): LABPROT, INR in the last 72 hours. ABG    Component Value Date/Time   PHART 7.422 09/29/2020 1106   HCO3 23.1 09/29/2020 1106   ACIDBASEDEF 0.7 09/29/2020 1106   O2SAT 97.0 09/29/2020 1106   CBG (last 3)  No results for input(s): GLUCAP in the last 72 hours.  Assessment/Plan: S/P Procedure(s) (LRB): XI ROBOTIC ASSISTED THORASCOPY-LEFT UPPER LOBECTOMY, possible thoracotomy (Left) INTERCOSTAL NERVE BLOCK (Left) NODE DISSECTION (Left) -POD # 3 lobectomy CXR hard to interpret due to SQ emphysema but no air leak with repeated coughing- dc  chest tube Good mobility Change acetaminophen to PRN Path pending   LOS: 3 days    Taylor Bailey 10/04/2020

## 2020-10-04 NOTE — Plan of Care (Signed)

## 2020-10-04 NOTE — Progress Notes (Signed)
Pt tolerated well removing the chest tube, site is CDI and dressing placed over the site. Pt is resting this time and will ambulate her in a while. Will continue to monitor the patient  Palma Holter, RN

## 2020-10-05 ENCOUNTER — Inpatient Hospital Stay (HOSPITAL_COMMUNITY): Payer: Medicare Other

## 2020-10-05 MED ORDER — OXYCODONE-ACETAMINOPHEN 5-325 MG PO TABS: 1.0000 | ORAL_TABLET | ORAL | 0 refills | Status: AC | PRN

## 2020-10-05 NOTE — Progress Notes (Signed)
IV removed without complications, site is clean dry and intact.

## 2020-10-05 NOTE — Care Management Important Message (Signed)
Important Message  Patient Details  Name: Taylor Bailey MRN: [id number] Date of Birth: Nov 12, 1945   Medicare Important Message Given:  Yes     Ezra Denne P Ranchester 10/05/2020, 3:15 PM

## 2020-10-05 NOTE — Plan of Care (Signed)

## 2020-10-05 NOTE — Progress Notes (Signed)
4 Days Post-Op Procedure(s) (LRB): XI ROBOTIC ASSISTED THORASCOPY-LEFT UPPER LOBECTOMY, possible thoracotomy (Left) INTERCOSTAL NERVE BLOCK (Left) NODE DISSECTION (Left) Subjective: No complaints, only took Tylenol once yesterday  Objective: Vital signs in last 24 hours: Temp:  [98.4 F (36.9 C)-98.8 F (37.1 C)] 98.5 F (36.9 C) (01/24 0313) Pulse Rate:  [77-82] 77 (01/24 0313) Cardiac Rhythm: Normal sinus rhythm (01/23 1916) Resp:  [15-20] 20 (01/24 0313) BP: (114-139)/(58-73) 124/67 (01/24 0313) SpO2:  [97 %-100 %] 98 % (01/24 0313)  Hemodynamic parameters for last 24 hours:    Intake/Output from previous day: 01/23 0701 - 01/24 0700 In: 840 [P.O.:840] Out: 920 [Urine:900; Chest Tube:20] Intake/Output this shift: No intake/output data recorded.  General appearance: alert, cooperative and no distress Neurologic: intact Heart: mildly tachy, regular Lungs: diminished breath sounds on left Wound: mild serous draiange from CT site  Lab Results: Recent Labs    10/03/20 0118  WBC 10.5  HGB 10.1*  HCT 29.4*  PLT 178   BMET:  Recent Labs    10/03/20 0118  NA 128*  K 3.7  CL 97*  CO2 21*  GLUCOSE 118*  BUN 10  CREATININE 0.53  CALCIUM 8.1*    PT/INR: No results for input(s): LABPROT, INR in the last 72 hours. ABG    Component Value Date/Time   PHART 7.422 09/29/2020 1106   HCO3 23.1 09/29/2020 1106   ACIDBASEDEF 0.7 09/29/2020 1106   O2SAT 97.0 09/29/2020 1106   CBG (last 3)  No results for input(s): GLUCAP in the last 72 hours.  Assessment/Plan: S/P Procedure(s) (LRB): XI ROBOTIC ASSISTED THORASCOPY-LEFT UPPER LOBECTOMY, possible thoracotomy (Left) INTERCOSTAL NERVE BLOCK (Left) NODE DISSECTION (Left) Plan for discharge: see discharge orders  Doing well  Path still pending Home today   LOS: 4 days    Melrose Nakayama 10/05/2020

## 2020-10-05 NOTE — Progress Notes (Incomplete)
Patient finish meal at

## 2020-10-05 NOTE — Progress Notes (Signed)
Discharge instructions (including medications) discussed with and copy provided to patient/caregiver 

## 2020-10-05 NOTE — Plan of Care (Signed)
  Problem: Education: Goal: Knowledge of General Education information will improve Description: Including pain rating scale, medication(s)/side effects and non-pharmacologic comfort measures Outcome: Adequate for Discharge   

## 2020-10-07 ENCOUNTER — Telehealth: Payer: Self-pay

## 2020-10-07 LAB — SURGICAL PATHOLOGY

## 2020-10-07 NOTE — Telephone Encounter (Signed)
Noted thanks-I think is reasonable to follow-up with surgeon and oncologist and follow-up with [place] if issues-tell her we are here for her if she needs anything and we are rrooting for her in her recovery

## 2020-10-07 NOTE — Telephone Encounter (Cosign Needed)
Transition Care Management Follow-up Telephone Call  Date of discharge and from where: [facility] 10/05/20  How have you been since you were released from the hospital? ok  Any questions or concerns? No  Items Reviewed:  Did the pt receive and understand the discharge instructions provided? Yes   Medications obtained and verified? Yes   Other? No   Any new allergies since your discharge? No   Dietary orders reviewed? Yes  Do you have support at home? Yes   Home Care and Equipment/Supplies: Were home health services ordered? not applicable If so, what is the name of the agency?   Has the agency set up a time to come to the patient's home? not applicable Were any new equipment or medical supplies ordered?  No What is the name of the medical supply agency?  Were you able to get the supplies/equipment? not applicable Do you have any questions related to the use of the equipment or supplies? No  Functional Questionnaire: (I = Independent and D = Dependent) ADLs: I  Bathing/Dressing- I  Meal Prep- I  Eating- I  Maintaining continence- I  Transferring/Ambulation- I  Managing Meds- I  Follow up appointments reviewed:   PCP Hospital f/u appt confirmed? No  Pt stated she was told to follow up with surgeon and oncology for the next 6 months for treatment and to call you if any other sickness should arise.  [facility] f/u appt confirmed? Yes  Scheduled to see Cardiothoracic Surgeon  on 10/27/20 @ 1:45 and have stitches removed on 10/12/20.  Are transportation arrangements needed? No   If their condition worsens, is the pt aware to call PCP or go to the Emergency Dept.? Yes  Was the patient provided with contact information for the PCP's office or ED? Yes  Was to pt encouraged to call back with questions or concerns? Yes

## 2020-10-12 ENCOUNTER — Other Ambulatory Visit: Payer: Self-pay

## 2020-10-12 ENCOUNTER — Ambulatory Visit (INDEPENDENT_AMBULATORY_CARE_PROVIDER_SITE_OTHER): Payer: Self-pay | Admitting: *Deleted

## 2020-10-12 ENCOUNTER — Ambulatory Visit: Payer: BC Managed Care – PPO | Admitting: Pulmonary Disease

## 2020-10-12 DIAGNOSIS — Z4802 Encounter for removal of sutures: Secondary | ICD-10-CM

## 2020-10-12 NOTE — Progress Notes (Signed)
Patient arrived for nurse visit to remove sutures post-RATs 10/01/20.  One suture removed with no signs or symptoms of infection noted.  Incision well approximated.  Patient tolerated suture removal well.  Patient instructed to keep the incision site clean and dry.  Patient states she is doing well after surgery but notices a slight wheeze at times. Patient encouraged to continue to use incentive spirometer. She states she uses it daily as well as walks around her house daily for exercise.  Patient  acknowledged instructions given.  All questions answered.

## 2020-10-15 ENCOUNTER — Other Ambulatory Visit: Payer: Self-pay | Admitting: *Deleted

## 2020-10-15 NOTE — Progress Notes (Signed)
The proposed treatment discussed in cancer conference is for discussion purpose only and is not a binding recommendation. The patient was not physically examined nor present for their treatment options. Therefore, final treatment plans cannot be decided.  ?

## 2020-10-26 ENCOUNTER — Other Ambulatory Visit: Payer: Self-pay | Admitting: Thoracic Surgery (Cardiothoracic Vascular Surgery)

## 2020-10-26 DIAGNOSIS — R918 Other nonspecific abnormal finding of lung field: Secondary | ICD-10-CM

## 2020-10-27 ENCOUNTER — Encounter: Payer: Self-pay | Admitting: Thoracic Surgery (Cardiothoracic Vascular Surgery)

## 2020-10-27 ENCOUNTER — Ambulatory Visit
Admission: RE | Admit: 2020-10-27 | Discharge: 2020-10-27 | Disposition: A | Discharge status: Home / Self Care | Payer: Medicare Other | Source: Ambulatory Visit | Attending: Thoracic Surgery (Cardiothoracic Vascular Surgery) | Admitting: Thoracic Surgery (Cardiothoracic Vascular Surgery)

## 2020-10-27 ENCOUNTER — Encounter: Payer: Self-pay | Admitting: *Deleted

## 2020-10-27 ENCOUNTER — Other Ambulatory Visit: Payer: Self-pay | Admitting: Thoracic Surgery (Cardiothoracic Vascular Surgery)

## 2020-10-27 ENCOUNTER — Ambulatory Visit (INDEPENDENT_AMBULATORY_CARE_PROVIDER_SITE_OTHER): Payer: Self-pay | Admitting: Thoracic Surgery (Cardiothoracic Vascular Surgery)

## 2020-10-27 ENCOUNTER — Other Ambulatory Visit: Payer: Self-pay

## 2020-10-27 VITALS — BP 150/84 | HR 100 | Temp 97.0°F | Resp 20 | Ht 65.0 in | Wt 155.6 lb

## 2020-10-27 DIAGNOSIS — D3A090 Benign carcinoid tumor of the bronchus and lung: Secondary | ICD-10-CM

## 2020-10-27 DIAGNOSIS — R918 Other nonspecific abnormal finding of lung field: Secondary | ICD-10-CM

## 2020-10-27 DIAGNOSIS — J90 Pleural effusion, not elsewhere classified: Secondary | ICD-10-CM

## 2020-10-27 DIAGNOSIS — C7A090 Malignant carcinoid tumor of the bronchus and lung: Secondary | ICD-10-CM

## 2020-10-27 NOTE — Progress Notes (Signed)
I notified scheduling to call and schedule Ms. Taylor Bailey to be seen with Dr. Julien Nordmann on 11/10/20 with labs

## 2020-10-27 NOTE — Progress Notes (Signed)
[address]       [place] [address]             [phone]     HPI: Ms. Taylor Bailey returns for a scheduled follow-up visit  Taylor Bailey is a 75 year old woman with a history of skin cancer, osteoporosis, and vitamin D deficiency.  She is a lifelong non-smoker.  She recently had a CT for coronary calcium score.  That showed a 2.7 x 2.3 cm left hilar mass.  A PET/CT showed an obstructive central left upper lobe lesion that was hypermetabolic.  Dr. Valeta Harms did bronchoscopy.  Biopsies showed a typical carcinoid tumor.  I did a robotic left upper lobectomy and node dissection on 10/01/2020.  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  She did well postoperatively and went home on day 4.  She feels well.  She is not taking anything for pain.  She does notice that she is uncomfortable trying to sleep in certain positions.  Past Medical History:  Diagnosis Date  . Anxiety    mediatation- last time as a teenager  . 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 Chicken pox   . Complication of anesthesia 2015   Colonoscopy  . Osteoporosis 10/2017   T score -2.9  . Vitamin D deficiency    takes 2000 units each day    Current Outpatient Medications  Medication Sig Dispense Refill  . Cholecalciferol (VITAMIN D) 50 MCG (2000 UT) CAPS Take 2,000 Units by mouth daily. Gummie    . fluticasone (FLONASE) 50 MCG/ACT nasal spray Place 2 sprays into both nostrils daily as needed for rhinitis.     No current facility-administered medications for this visit.    Physical Exam BP (!) 150/84 (BP Location: Left Arm, Patient Position: Sitting, Cuff Size: Normal)   Pulse 100   Temp (!) 97 F (36.1 C) (Skin)   Resp 20   Ht 5\' 5"  (1.651 m)   Wt 155 lb 9.6 oz (70.6 kg)   SpO2 95% Comment: RA  BMI 25.64 kg/m  75 year old woman in no acute distress Alert and oriented x3 with no focal deficits Lungs absent breath sounds left  base, otherwise clear Incisions clean dry and intact  Diagnostic Tests: CHEST - 2 VIEW  COMPARISON:  Chest radiograph October 05, 2020  FINDINGS: The heart size and mediastinal contours are partially obscured but appear within normal limits. Postsurgical change in the left hemithorax with a moderate left pleural effusion. No focal consolidation. No visible pneumothorax. the visualized skeletal structures are unchanged.  IMPRESSION: Postsurgical change in the left hemithorax with a moderate left pleural effusion.   Electronically Signed   By: Dahlia Bailiff MD   On: 10/27/2020 13:38 I personally reviewed the chest x-ray images and concur with the findings noted above  Impression: Taylor Bailey is a 75 year old non-smoker with a past medical history significant for skin cancer, osteoporosis, and vitamin D deficiency.  She recently had a CT for coronary calcium scoring.  Her coronary calcium score was 0 but she was incidentally found to have a left hilar mass.  Dr. Valeta Harms did bronchoscopy which showed a typical carcinoid tumor.  I did a robotic left upper lobectomy on 10/01/2020.  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  Early postoperative course was uncomplicated and she went home on day 4.  Overall she is  doing well.  She is about 3 weeks out from surgery.  She has not taken any narcotics since the second week.  She does have a moderate loculated left pleural effusion.  I recommended to her that we do an ultrasound-guided thoracentesis to try and drain as much fluid as we can.  We will test it with cell count differential, protein, glucose, and LDH.  I suspect this is just a postoperative inflammatory effusion.  We will arrange for her to follow-up with Dr. Julien Nordmann since she had stage IIIa disease.  Plan: Ultrasound-guided thoracentesis of left pleural effusion Follow-up with Dr. Julien Nordmann Return in 2 weeks with PA  lateral chest x-ray   Melrose Nakayama, MD [facility] [phone]

## 2020-10-28 ENCOUNTER — Telehealth: Payer: Self-pay | Admitting: Internal Medicine

## 2020-10-28 ENCOUNTER — Other Ambulatory Visit (HOSPITAL_COMMUNITY)
Admission: RE | Admit: 2020-10-28 | Discharge: 2020-10-28 | Disposition: A | Discharge status: Home / Self Care | Payer: Medicare Other | Source: Ambulatory Visit | Attending: Thoracic Surgery (Cardiothoracic Vascular Surgery) | Admitting: Thoracic Surgery (Cardiothoracic Vascular Surgery)

## 2020-10-28 DIAGNOSIS — Z01812 Encounter for preprocedural laboratory examination: Secondary | ICD-10-CM | POA: Insufficient documentation

## 2020-10-28 DIAGNOSIS — Z20822 Contact with and (suspected) exposure to covid-19: Secondary | ICD-10-CM | POA: Diagnosis not present

## 2020-10-28 LAB — SARS CORONAVIRUS 2 (TAT 6-24 HRS): SARS Coronavirus 2: NEGATIVE

## 2020-10-28 NOTE — Telephone Encounter (Signed)
Scheduled appt per 2/15 sch msg - unable to reach pt - left message for pt with appt date and time

## 2020-10-30 ENCOUNTER — Encounter: Payer: Self-pay | Admitting: *Deleted

## 2020-10-30 ENCOUNTER — Ambulatory Visit (HOSPITAL_COMMUNITY)
Admission: RE | Admit: 2020-10-30 | Discharge: 2020-10-30 | Disposition: A | Discharge status: Home / Self Care | Payer: Medicare Other | Source: Ambulatory Visit | Attending: Thoracic Surgery (Cardiothoracic Vascular Surgery) | Admitting: Thoracic Surgery (Cardiothoracic Vascular Surgery)

## 2020-10-30 ENCOUNTER — Other Ambulatory Visit: Payer: Self-pay | Admitting: Thoracic Surgery (Cardiothoracic Vascular Surgery)

## 2020-10-30 ENCOUNTER — Other Ambulatory Visit: Payer: Self-pay

## 2020-10-30 ENCOUNTER — Telehealth: Payer: Self-pay | Admitting: Internal Medicine

## 2020-10-30 DIAGNOSIS — J90 Pleural effusion, not elsewhere classified: Secondary | ICD-10-CM | POA: Insufficient documentation

## 2020-10-30 DIAGNOSIS — R918 Other nonspecific abnormal finding of lung field: Secondary | ICD-10-CM

## 2020-10-30 HISTORY — PX: IR THORACENTESIS ASP PLEURAL SPACE W/IMG GUIDE: IMG5380

## 2020-10-30 LAB — PROTEIN, PLEURAL OR PERITONEAL FLUID: Total protein, fluid: 4.3 g/dL

## 2020-10-30 LAB — BODY FLUID CELL COUNT WITH DIFFERENTIAL
Eos, Fluid: 1 %
Lymphs, Fluid: 65 %
Monocyte-Macrophage-Serous Fluid: 31 % — ABNORMAL LOW (ref 50–90)
Neutrophil Count, Fluid: 3 % (ref 0–25)
Total Nucleated Cell Count, Fluid: 590 cu mm (ref 0–1000)

## 2020-10-30 LAB — GLUCOSE, PLEURAL OR PERITONEAL FLUID: Glucose, Fluid: 118 mg/dL

## 2020-10-30 LAB — LACTATE DEHYDROGENASE, PLEURAL OR PERITONEAL FLUID: LD, Fluid: 99 U/L — ABNORMAL HIGH (ref 3–23)

## 2020-10-30 MED ORDER — LIDOCAINE HCL (PF) 1 % IJ SOLN
INTRAMUSCULAR | Status: DC | PRN
  Administered 2020-10-30: 10 mL

## 2020-10-30 MED ORDER — LIDOCAINE HCL 1 % IJ SOLN
INTRAMUSCULAR | Status: AC
  Filled 2020-10-30: qty 20

## 2020-10-30 NOTE — Procedures (Signed)
PROCEDURE SUMMARY:  Successful image-guided LEFT thoracentesis. Yielded 1.6L of clear yellow fluid. Pt tolerated procedure well. No immediate complications. EBL = trace   Specimen was sent for labs. CXR ordered.  Please see imaging section of Epic for full dictation.  Armando Gang Virgil Slinger PA-C 10/30/2020 10:59 AM

## 2020-10-30 NOTE — Telephone Encounter (Signed)
Called pt per 2/16 sch msg - no answer. Left message for pt to call back to reschedule appt.

## 2020-10-30 NOTE — Progress Notes (Signed)
    Oncology Nurse Navigator Flowsheets 10/30/2020  Abnormal Finding Date 07/31/2020  Confirmed Diagnosis Date 08/28/2020  Diagnosis Status Confirmed Diagnosis Complete  Planned Course of Treatment Surgery  Phase of Treatment Surgery  Surgery Actual Start Date: 10/01/2020  Navigator Follow Up Date: 11/10/2020  Navigator Follow Up Reason: Follow-up Appointment  Navigator Location [facility]  Referral Date to RadOnc/MedOnc 08/27/2020  Navigator Encounter Type Other:  Treatment Initiated Date 10/01/2020  Patient Visit Type Other  Treatment Phase Other  Barriers/Navigation Needs Coordination of Care/patient received surgery and has a follow up with med onc on 11/10/20 to discuss plan of care.   Interventions Coordination of Care  Acuity Level 2-Minimal Needs (1-2 Barriers Identified)  Coordination of Care Other  Time Spent with Patient 30

## 2020-11-02 LAB — PATHOLOGIST SMEAR REVIEW

## 2020-11-09 ENCOUNTER — Other Ambulatory Visit: Payer: Self-pay | Admitting: Thoracic Surgery (Cardiothoracic Vascular Surgery)

## 2020-11-09 DIAGNOSIS — R918 Other nonspecific abnormal finding of lung field: Secondary | ICD-10-CM

## 2020-11-10 ENCOUNTER — Other Ambulatory Visit: Payer: Self-pay

## 2020-11-10 ENCOUNTER — Inpatient Hospital Stay: Payer: Medicare Other

## 2020-11-10 ENCOUNTER — Inpatient Hospital Stay: Payer: Medicare Other | Attending: Internal Medicine | Admitting: Internal Medicine

## 2020-11-10 ENCOUNTER — Ambulatory Visit (INDEPENDENT_AMBULATORY_CARE_PROVIDER_SITE_OTHER): Payer: Self-pay | Admitting: Thoracic Surgery (Cardiothoracic Vascular Surgery)

## 2020-11-10 ENCOUNTER — Encounter: Payer: Self-pay | Admitting: Internal Medicine

## 2020-11-10 ENCOUNTER — Ambulatory Visit
Admission: RE | Admit: 2020-11-10 | Discharge: 2020-11-10 | Disposition: A | Discharge status: Home / Self Care | Payer: Medicare Other | Source: Ambulatory Visit | Attending: Thoracic Surgery (Cardiothoracic Vascular Surgery) | Admitting: Thoracic Surgery (Cardiothoracic Vascular Surgery)

## 2020-11-10 ENCOUNTER — Encounter: Payer: Self-pay | Admitting: Thoracic Surgery (Cardiothoracic Vascular Surgery)

## 2020-11-10 ENCOUNTER — Other Ambulatory Visit: Payer: Self-pay | Admitting: Internal Medicine

## 2020-11-10 VITALS — BP 152/66 | HR 93 | Temp 97.6°F | Resp 16 | Ht 65.0 in | Wt 155.9 lb

## 2020-11-10 VITALS — BP 156/82 | HR 90 | Temp 97.7°F | Resp 20 | Ht 65.0 in | Wt 155.8 lb

## 2020-11-10 DIAGNOSIS — C7A090 Malignant carcinoid tumor of the bronchus and lung: Secondary | ICD-10-CM | POA: Insufficient documentation

## 2020-11-10 DIAGNOSIS — F419 Anxiety disorder, unspecified: Secondary | ICD-10-CM | POA: Insufficient documentation

## 2020-11-10 DIAGNOSIS — M810 Age-related osteoporosis without current pathological fracture: Secondary | ICD-10-CM | POA: Diagnosis not present

## 2020-11-10 DIAGNOSIS — J90 Pleural effusion, not elsewhere classified: Secondary | ICD-10-CM | POA: Diagnosis not present

## 2020-11-10 DIAGNOSIS — D3A090 Benign carcinoid tumor of the bronchus and lung: Secondary | ICD-10-CM

## 2020-11-10 DIAGNOSIS — E559 Vitamin D deficiency, unspecified: Secondary | ICD-10-CM | POA: Diagnosis not present

## 2020-11-10 DIAGNOSIS — C3490 Malignant neoplasm of unspecified part of unspecified bronchus or lung: Secondary | ICD-10-CM

## 2020-11-10 DIAGNOSIS — R918 Other nonspecific abnormal finding of lung field: Secondary | ICD-10-CM

## 2020-11-10 DIAGNOSIS — Z902 Acquired absence of lung [part of]: Secondary | ICD-10-CM

## 2020-11-10 LAB — CMP (CANCER CENTER ONLY)
ALT: 14 U/L (ref 0–44)
AST: 18 U/L (ref 15–41)
Albumin: 3.7 g/dL (ref 3.5–5.0)
Alkaline Phosphatase: 72 U/L (ref 38–126)
Anion gap: 9 (ref 5–15)
BUN: 12 mg/dL (ref 8–23)
CO2: 26 mmol/L (ref 22–32)
Calcium: 9.2 mg/dL (ref 8.9–10.3)
Chloride: 107 mmol/L (ref 98–111)
Creatinine: 0.79 mg/dL (ref 0.44–1.00)
GFR, Estimated: >60 mL/min (ref >60)
Glucose, Bld: 118 mg/dL — ABNORMAL HIGH (ref 70–99)
Potassium: 4.1 mmol/L (ref 3.5–5.1)
Sodium: 142 mmol/L (ref 135–145)
Total Bilirubin: 0.3 mg/dL (ref 0.3–1.2)
Total Protein: 6.7 g/dL (ref 6.5–8.1)

## 2020-11-10 LAB — CBC WITH DIFFERENTIAL (CANCER CENTER ONLY)
Abs Immature Granulocytes: 0.01 10*3/uL (ref 0.00–0.07)
Basophils Absolute: 0.1 10*3/uL (ref 0.0–0.1)
Basophils Relative: 1 %
Eosinophils Absolute: 0.1 10*3/uL (ref 0.0–0.5)
Eosinophils Relative: 1 %
HCT: 36.9 % (ref 36.0–46.0)
Hemoglobin: 11.6 g/dL — ABNORMAL LOW (ref 12.0–15.0)
Immature Granulocytes: 0 %
Lymphocytes Relative: 27 %
Lymphs Abs: 1.8 10*3/uL (ref 0.7–4.0)
MCH: 27.2 pg (ref 26.0–34.0)
MCHC: 31.4 g/dL (ref 30.0–36.0)
MCV: 86.6 fL (ref 80.0–100.0)
Monocytes Absolute: 0.7 10*3/uL (ref 0.1–1.0)
Monocytes Relative: 11 %
Neutro Abs: 3.9 10*3/uL (ref 1.7–7.7)
Neutrophils Relative %: 60 %
Platelet Count: 285 10*3/uL (ref 150–400)
RBC: 4.26 MIL/uL (ref 3.87–5.11)
RDW: 13.8 % (ref 11.5–15.5)
WBC Count: 6.5 10*3/uL (ref 4.0–10.5)
nRBC: 0 % (ref 0.0–0.2)

## 2020-11-10 NOTE — Progress Notes (Signed)
[address]       [place] [address]             [phone]     HPI: Taylor Bailey returns for follow-up of her pleural effusion.  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  She is a lifelong non-smoker.  She had a CT for coronary calcium score which showed a left hilar mass.  PET/CT showed an obstructive central left upper lobe lesion was hypermetabolic.  Biopsies by Dr. Valeta Harms showed a typical carcinoid tumor.  I did a robotic left upper lobectomy and node dissection on 10/01/2020.  Final pathology showed T1, N2, stage IIIa typical carcinoid tumor.  She did well postoperatively and went home on day 4.  I saw her back in the office on 10/27/2020.  She was not taking any narcotics at that time.  She was having some trouble sleeping.  She had a rather sizable pleural effusion.  We did an ultrasound-guided thoracentesis which drained 1.6 L of fluid.  She did have some symptomatic improvement after that.  The fluid did have some inflammatory markers with an elevated LDH.  Over the past couple of weeks she is continue to feel better.  She is currently walking 2-1/2 miles a day.  She was walking 5 miles prior to surgery.  She denies any pain.  She is able to sleep without any difficulty.  She is not using any narcotics.  Past Medical History:  Diagnosis Date  . Anxiety    mediatation- last time as a teenager  . 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 Chicken pox   . Complication of anesthesia 2015   Colonoscopy  . Osteoporosis 10/2017   T score -2.9  . Vitamin D deficiency    takes 2000 units each day     Current Outpatient Medications  Medication Sig Dispense Refill  . Cholecalciferol (VITAMIN D) 50 MCG (2000 UT) CAPS Take 2,000 Units by mouth daily. Gummie    . fluticasone (FLONASE) 50 MCG/ACT nasal spray Place 2  sprays into both nostrils daily as needed for rhinitis.     No current facility-administered medications for this visit.    Physical Exam BP (!) 156/82 (BP Location: Right Arm, Patient Position: Sitting, Cuff Size: Normal)   Pulse 90   Temp 97.7 F (36.5 C) (Skin)   Resp 20   Ht 5\' 5"  (1.651 m)   Wt 155 lb 12.8 oz (70.7 kg)   SpO2 97% Comment: RA  BMI 25.92 kg/m  75 year old woman in no acute distress Alert and oriented x3 with no focal deficits Lungs diminished at left base but otherwise clear Cardiac regular rate and rhythm No peripheral edema  Diagnostic Tests: I personally reviewed her chest x-ray.  Shows postoperative changes.  There is been partial reaccumulation of pleural effusion although it is difficult to tell how much based on the post thoracentesis film being a portable.  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  She had a robotic left upper lobectomy for a stage IIIa carcinoid tumor on 10/01/2020.  She is now about 6 weeks out from surgery.  She is doing well with no incisional pain.  Her exercise tolerance is  improving.  Left pleural effusion-possible partial reaccumulation.  We discussed possible use of prednisone taper but she has had adverse reactions to prednisone previously.  We will just plan to watch this and I will see her back in a couple weeks with a chest x-ray to make sure the fluid is not increasing.  She has an appointment with Dr. Julien Nordmann later today.  Plan: Return in 2 weeks with PA lateral chest x-ray  Melrose Nakayama, MD [facility] [phone]

## 2020-11-10 NOTE — Progress Notes (Signed)
[facility] [phone]   [phone]  OFFICE PROGRESS NOTE  [name], MD [place] [address]  DIAGNOSIS: Stage IIA (T1 a, N1, M0) low-grade neuroendocrine carcinoma, carcinoid tumor diagnosed in January 2022  PRIOR THERAPY: Status post left upper lobectomy with lymph node dissection under the care of Dr. Roxan Hockey on October 01, 2020.  CURRENT THERAPY: Observation  INTERVAL HISTORY: Taylor Bailey 75 y.o. female returns to the clinic today for follow-up visit.  The patient is feeling fine today with no concerning complaints.  She is recovering from her recent surgery fairly well with no concerning complaints and the soreness in her left side of the chest had improved.  She denied having any shortness of breath, cough or hemoptysis.  She denied having any fever or chills.  She has no nausea, vomiting, diarrhea or constipation.  She has no significant weight loss or night sweats.  She has no headache or visual changes.  She is here today for evaluation and discussion of her adjuvant treatment options.  MEDICAL HISTORY: Past Medical History:  Diagnosis Date  . Anxiety    mediatation- last time as a teenager  . 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 Chicken pox   . Complication of anesthesia 2015   Colonoscopy  . Osteoporosis 10/2017   T score -2.9  . Vitamin D deficiency    takes 2000 units each day    ALLERGIES:  is allergic to fluoride preparations, influenza vaccines, and prednisone.  MEDICATIONS:  Current Outpatient Medications  Medication Sig Dispense Refill  . Cholecalciferol (VITAMIN D) 50 MCG (2000 UT) CAPS Take 2,000 Units by mouth daily. Gummie    . fluticasone (FLONASE) 50 MCG/ACT nasal spray Place 2 sprays into both nostrils daily as needed for rhinitis.     No current facility-administered medications for this visit.    SURGICAL HISTORY:  Past  Surgical History:  Procedure Laterality Date  . BREAST BIOPSY Left    BENIGN  . BREAST EXCISIONAL BIOPSY Left   . BRONCHIAL NEEDLE ASPIRATION BIOPSY  08/28/2020   Procedure: BRONCHIAL NEEDLE ASPIRATION BIOPSIES;  Surgeon: Garner Nash, DO;  Location: [facility];  Service: Pulmonary;;  . CATARACT EXTRACTION, BILATERAL  2018  . COLONOSCOPY    . INTERCOSTAL NERVE BLOCK Left 10/01/2020   Procedure: INTERCOSTAL NERVE BLOCK;  Surgeon: Melrose Nakayama, MD;  Location: [facility];  Service: Thoracic;  Laterality: Left;  . IR THORACENTESIS ASP PLEURAL SPACE W/IMG GUIDE  10/30/2020  . NODE DISSECTION Left 10/01/2020   Procedure: NODE DISSECTION;  Surgeon: Melrose Nakayama, MD;  Location: [facility];  Service: Thoracic;  Laterality: Left;  . TUBAL LIGATION    . VIDEO BRONCHOSCOPY WITH ENDOBRONCHIAL ULTRASOUND Bilateral 08/28/2020   Procedure: VIDEO BRONCHOSCOPY WITH ENDOBRONCHIAL ULTRASOUND;  Surgeon: Garner Nash, DO;  Location: [facility];  Service: Pulmonary;  Laterality: Bilateral;  . WISDOM TOOTH EXTRACTION      REVIEW OF SYSTEMS:  Constitutional: positive for fatigue Eyes: negative Ears, nose, mouth, throat, and face: negative Respiratory: negative Cardiovascular: negative Gastrointestinal: negative Genitourinary:negative Integument/breast: negative Hematologic/lymphatic: negative Musculoskeletal:negative Neurological: negative Behavioral/Psych: negative Endocrine: negative Allergic/Immunologic: negative   PHYSICAL EXAMINATION: General appearance: alert, cooperative, fatigued and no distress Head: Normocephalic, without obvious abnormality, atraumatic Neck: no adenopathy, no JVD, supple, symmetrical, trachea midline and thyroid not enlarged, symmetric, no tenderness/mass/nodules Lymph nodes: Cervical, supraclavicular, and axillary nodes normal. Resp:  clear to auscultation bilaterally Back: symmetric, no curvature. ROM normal. No CVA tenderness. Cardio: regular rate and  rhythm, S1, S2 normal, no murmur, click, rub or gallop GI: soft, non-tender; bowel sounds normal; no masses,  no organomegaly Extremities: extremities normal, atraumatic, no cyanosis or edema Neurologic: Alert and oriented X 3, normal strength and tone. Normal symmetric reflexes. Normal coordination and gait  ECOG PERFORMANCE STATUS: 1 - Symptomatic but completely ambulatory  Blood pressure (!) 152/66, pulse 93, temperature 97.6 F (36.4 C), temperature source Tympanic, resp. rate 16, height 5\' 5"  (1.651 m), weight 155 lb 14.4 oz (70.7 kg), SpO2 98 %.  LABORATORY DATA: Lab Results  Component Value Date   WBC 6.5 11/10/2020   HGB 11.6 (L) 11/10/2020   HCT 36.9 11/10/2020   MCV 86.6 11/10/2020   PLT 285 11/10/2020      Chemistry      Component Value Date/Time   NA 128 (L) 10/03/2020 0118   K 3.7 10/03/2020 0118   CL 97 (L) 10/03/2020 0118   CO2 21 (L) 10/03/2020 0118   BUN 10 10/03/2020 0118   CREATININE 0.53 10/03/2020 0118   CREATININE 0.79 09/08/2020 1329      Component Value Date/Time   CALCIUM 8.1 (L) 10/03/2020 0118   ALKPHOS 44 10/03/2020 0118   AST 20 10/03/2020 0118   AST 17 09/08/2020 1329   ALT 13 10/03/2020 0118   ALT 16 09/08/2020 1329   BILITOT 0.4 10/03/2020 0118   BILITOT 0.3 09/08/2020 1329       RADIOGRAPHIC STUDIES: DG Chest 1 View  Result Date: 10/30/2020 CLINICAL DATA:  Thoracentesis. EXAM: CHEST  1 VIEW COMPARISON:  Three days ago FINDINGS: Left-sided pleural effusion and pulmonary opacity with volume loss. Clear right lung. No visible pneumothorax or other complicating feature. IMPRESSION: No acute finding after thoracentesis. Electronically Signed   By: Monte Fantasia M.D.   On: 10/30/2020 11:14   DG Chest 2 View  Result Date: 11/10/2020 CLINICAL DATA:  Lung mass. EXAM: CHEST - 2 VIEW COMPARISON:  October 30, 2020. FINDINGS: Stable cardiomediastinal silhouette. No pneumothorax is noted. Right lung is clear. Stable left lower lobe opacity is  noted concerning for pleural effusion with associated atelectasis. Bony thorax is unremarkable. IMPRESSION: Stable left lower lobe opacity concerning for pleural effusion with associated atelectasis. No pneumothorax. Electronically Signed   By: Marijo Conception M.D.   On: 11/10/2020 09:36   DG Chest 2 View  Result Date: 10/27/2020 CLINICAL DATA:  Status post left lobectomy EXAM: CHEST - 2 VIEW COMPARISON:  Chest radiograph October 05, 2020 FINDINGS: The heart size and mediastinal contours are partially obscured but appear within normal limits. Postsurgical change in the left hemithorax with a moderate left pleural effusion. No focal consolidation. No visible pneumothorax. the visualized skeletal structures are unchanged. IMPRESSION: Postsurgical change in the left hemithorax with a moderate left pleural effusion. Electronically Signed   By: Dahlia Bailiff MD   On: 10/27/2020 13:38   IR THORACENTESIS ASP PLEURAL SPACE W/IMG GUIDE  Result Date: 10/30/2020 INDICATION: Shortness of breath, status post left upper lobectomy, left pleural effusion EXAM: ULTRASOUND GUIDED left THORACENTESIS MEDICATIONS: 10 mL 1% lidocaine COMPLICATIONS: None immediate. PROCEDURE: An ultrasound guided thoracentesis was thoroughly discussed with the patient and questions answered. The benefits, risks, alternatives and complications were also discussed. The patient understands and wishes to proceed with the procedure. Written consent was obtained. Ultrasound was performed to localize and mark an adequate pocket of fluid in the left chest.  The area was then prepped and draped in the normal sterile fashion. 1% Lidocaine was used for local anesthesia. Under ultrasound guidance a 6 Fr Safe-T-Centesis catheter was introduced. Thoracentesis was performed. The catheter was removed and a dressing applied. Post procedure chest X-ray reviewed, negative for pneumothorax. FINDINGS: A total of approximately 1.6 L of clear yellow fluid was removed.  Samples were sent to the laboratory as requested by the clinical team. IMPRESSION: Successful ultrasound guided left thoracentesis yielding 1.6 L of pleural fluid. Read by: Durenda [place], PA-C Electronically Signed   By: Miachel Roux M.D.   On: 10/30/2020 11:48    ASSESSMENT AND PLAN: This is a very pleasant 75 years old white female recently diagnosed with a stage IIA (T1 a, N1, M0) low-grade neuroendocrine carcinoma, carcinoid tumor diagnosed in January 2022 status post left lower lobectomy with lymph node dissection under the care of Dr. Roxan Hockey on October 01, 2020. The patient is recovering well from her surgery. I had a lengthy discussion with the patient today about her current condition and treatment options.  I explained to the patient that there is no role for adjuvant systemic chemotherapy or radiation for patient with early stage carcinoid tumor. I recommended for the patient to continue on observation with repeat CT scan of the chest in 6 months. The patient was advised to call immediately if she has any other concerning symptoms in the interval. The patient voices understanding of current disease status and treatment options and is in agreement with the current care plan.  All questions were answered. The patient knows to call the clinic with any problems, questions or concerns. We can certainly see the patient much sooner if necessary.  The total time spent in the appointment was 30 minutes.  Disclaimer: This note was dictated with voice recognition software. Similar sounding words can inadvertently be transcribed and may not be corrected upon review.

## 2020-11-11 ENCOUNTER — Telehealth: Payer: Self-pay | Admitting: Internal Medicine

## 2020-11-11 NOTE — Telephone Encounter (Signed)
Scheduled per los. Called and spoke with patient. Confirmed appt 

## 2020-11-23 ENCOUNTER — Other Ambulatory Visit: Payer: Self-pay | Admitting: Thoracic Surgery (Cardiothoracic Vascular Surgery)

## 2020-11-23 DIAGNOSIS — R918 Other nonspecific abnormal finding of lung field: Secondary | ICD-10-CM

## 2020-11-24 ENCOUNTER — Ambulatory Visit
Admission: RE | Admit: 2020-11-24 | Discharge: 2020-11-24 | Disposition: A | Discharge status: Home / Self Care | Payer: Medicare Other | Source: Ambulatory Visit | Attending: Thoracic Surgery (Cardiothoracic Vascular Surgery) | Admitting: Thoracic Surgery (Cardiothoracic Vascular Surgery)

## 2020-11-24 ENCOUNTER — Ambulatory Visit (INDEPENDENT_AMBULATORY_CARE_PROVIDER_SITE_OTHER): Payer: Self-pay | Admitting: Thoracic Surgery (Cardiothoracic Vascular Surgery)

## 2020-11-24 ENCOUNTER — Other Ambulatory Visit: Payer: Self-pay

## 2020-11-24 VITALS — BP 140/85 | HR 73 | Resp 20 | Ht 65.0 in | Wt 155.0 lb

## 2020-11-24 DIAGNOSIS — Z902 Acquired absence of lung [part of]: Secondary | ICD-10-CM

## 2020-11-24 DIAGNOSIS — R918 Other nonspecific abnormal finding of lung field: Secondary | ICD-10-CM

## 2020-11-24 DIAGNOSIS — C7A090 Malignant carcinoid tumor of the bronchus and lung: Secondary | ICD-10-CM

## 2020-11-24 DIAGNOSIS — J90 Pleural effusion, not elsewhere classified: Secondary | ICD-10-CM

## 2020-11-24 MED ORDER — PREDNISONE 10 MG (21) PO TBPK: ORAL_TABLET | ORAL | 0 refills | Status: AC

## 2020-11-24 NOTE — Progress Notes (Signed)
[address]       [place] [address]             [phone]    HPI: Taylor Bailey returns for scheduled follow-up visit after left upper lobectomy.  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    She was found to have a left upper lobe lung mass on a CT for coronary calcium score.  On PET CT there was an obstructive central left upper lobe lesion that was hypermetabolic.  Dr. Valeta Harms did biopsies which showed a typical carcinoid tumor.  I did a robotic left upper lobectomy and node dissection on 10/01/2020.  Final path showed typical carcinoid tumor T1, N2, stage IIIa.  She did well postoperatively and went home on day 4.  I saw her on 10/27/2020 when she had a sizable pleural effusion.  Ultrasound-guided thoracentesis drained 1.6 L of fluid.  She had some symptomatic improvement.  I saw her back again on 11/10/2020 and there was still some loculated fluid in an unusual triangular shape anterior laterally on the left side.  She continues to do well from a recovery standpoint.  She is walking about 2 miles a day.  She was walking 5 miles prior to surgery.  She is not taking any narcotics for pain.  She is gradually building back into her normal activities.  Past Medical History:  Diagnosis Date  . Anxiety    mediatation- last time as a teenager  . 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 Chicken pox   . Complication of anesthesia 2015   Colonoscopy  . Osteoporosis 10/2017   T score -2.9  . Vitamin D deficiency    takes 2000 units each day    Current Outpatient Medications  Medication Sig Dispense Refill  . Cholecalciferol (VITAMIN D) 50 MCG (2000 UT) CAPS Take 2,000 Units by mouth daily. Gummie    . fluticasone (FLONASE) 50 MCG/ACT nasal spray Place 2 sprays into both nostrils daily as needed for rhinitis.    .  predniSONE (STERAPRED UNI-PAK 21 TAB) 10 MG (21) TBPK tablet Take 5 tablets (50 mg total) by mouth daily for 1 day, THEN 4 tablets (40 mg total) daily for 1 day, THEN 3 tablets (30 mg total) daily for 1 day, THEN 2 tablets (20 mg total) daily for 1 day, THEN 1 tablet (10 mg total) daily for 1 day. 15 tablet 0   No current facility-administered medications for this visit.    Physical Exam BP 140/85   Pulse 73   Resp 20   Ht 5\' 5"  (1.651 m)   Wt 155 lb (70.3 kg)   SpO2 98% Comment: RA  BMI 25.51 kg/m  75 year old woman in no acute distress Alert and oriented x3 with no focal deficits Diminished breath sounds at left base but otherwise clear Cardiac regular rate and rhythm  Diagnostic Tests: CHEST - 2 VIEW  COMPARISON:  11/10/20  FINDINGS: Postoperative change from left upper lobectomy compensatory hyperinflation of the left lower lobe noted. No pneumothorax identified. Suspect left pleural effusion with veil like opacification of the left midlung and left lower lobe. Right lung appears clear.  IMPRESSION: 1. Stable exam compared with 11/10/2020 status post left upper lobectomy. No pneumothorax identified. 2. Suspect left pleural effusion.   Electronically Signed   By:  Kerby Moors M.D.   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  She had a robotic left upper lobectomy and node dissection for a stage IIIa (T1, N2) typical carcinoid tumor on 10/01/2020.  Overall she is doing extremely well post surgery.  She is not having much pain in her exercise tolerance is returning.  She does have a loculated unusual appearing left pleural effusion.  She had a thoracentesis about a month ago which drained 1.6 L.  There was some residual fluid loculated anterior laterally and triangular-shaped.  That persists on her current film even  though the posterior component of the effusion has not returned.  I suspect this is just a postoperative space related to her lobectomy and will always be there.  However there is a possibility that it is an inflammatory effusion that could improve.  There is no easy way to approach that for thoracentesis.  We discussed a prednisone taper the last time she was here but she did not want to do it.  She says that prednisone can make her jittery.  She actually brought up the idea of doing a prednisone taper this time around and I think it is worth trying.  If she has any side effects she can just stop it.  There are no restrictions on her activities.  Plan: Prednisone taper Return in 3 weeks with PA and lateral chest x-ray  Melrose Nakayama, MD [facility] [phone]

## 2020-11-26 ENCOUNTER — Other Ambulatory Visit: Payer: Self-pay | Admitting: Internal Medicine

## 2020-12-14 ENCOUNTER — Other Ambulatory Visit: Payer: Self-pay | Admitting: Thoracic Surgery (Cardiothoracic Vascular Surgery)

## 2020-12-14 DIAGNOSIS — Z902 Acquired absence of lung [part of]: Secondary | ICD-10-CM

## 2020-12-15 ENCOUNTER — Other Ambulatory Visit: Payer: Self-pay

## 2020-12-15 ENCOUNTER — Ambulatory Visit (INDEPENDENT_AMBULATORY_CARE_PROVIDER_SITE_OTHER): Payer: Medicare Other | Admitting: Thoracic Surgery (Cardiothoracic Vascular Surgery)

## 2020-12-15 ENCOUNTER — Ambulatory Visit
Admission: RE | Admit: 2020-12-15 | Discharge: 2020-12-15 | Disposition: A | Discharge status: Home / Self Care | Payer: Medicare Other | Source: Ambulatory Visit | Attending: Thoracic Surgery (Cardiothoracic Vascular Surgery) | Admitting: Thoracic Surgery (Cardiothoracic Vascular Surgery)

## 2020-12-15 VITALS — BP 140/80 | HR 87 | Resp 20 | Ht 65.0 in | Wt 158.0 lb

## 2020-12-15 DIAGNOSIS — Z902 Acquired absence of lung [part of]: Secondary | ICD-10-CM

## 2020-12-15 DIAGNOSIS — C7A090 Malignant carcinoid tumor of the bronchus and lung: Secondary | ICD-10-CM

## 2020-12-15 NOTE — Progress Notes (Signed)
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  She had a CT for coronary calcium score which showed a left upper lobe lung mass.  Biopsy showed carcinoid tumor.  I did a robotic left upper lobectomy on 10/01/2020.  Final pathology showed a typical low-grade carcinoid tumor stage IIIa (T1, N2).  She did well postoperatively and went home on day 4.  I saw her on 10/27/2020.  She had pretty sizable pleural effusion with ultrasound-guided thoracentesis which drained 1.6 L of fluid.  There was some symptomatic improvement.  On follow-up she had a loculated effusion anterior laterally on the left.  Follow-up at that a couple weeks later showed no change.  We treated her with prednisone and she now returns for additional follow-up.  She is recovering well.  She is not taking anything for pain.  She says that removal of the skin cancer on her wrist her more than the thoracic surgery.  She still notices some shortness of breath with exertion.  She can pace herself now so that that does not become an issue.  She saw Dr. Julien Bailey.  He recommended observation.  Past Medical History:  Diagnosis Date  . Anxiety    mediatation- last time as a teenager  . Cancer (Taylor Bailey) 2020   basal cell and squamos cell removed from left leg  . Chicken pox   . Complication of anesthesia 2015   Colonoscopy  . Osteoporosis 10/2017   T score -2.9  . Vitamin D deficiency    takes 2000 units each day    Current Outpatient Medications  Medication Sig Dispense Refill  . Cholecalciferol (VITAMIN D) 50 MCG (2000 UT) CAPS Take 2,000 Units by mouth daily. Gummie    . fluticasone (FLONASE) 50 MCG/ACT nasal spray Place 2 sprays into both nostrils daily as  needed for rhinitis.     No current facility-administered medications for this visit.    Physical Exam BP 140/80   Pulse 87   Resp 20   Ht 5\' 5"  (1.651 m)   Wt 158 lb (71.7 kg)   SpO2 97% Comment: RA  BMI 26.11 kg/m  75 year old woman in no acute distress Alert and oriented x3 with no focal deficits Lungs diminished at left base but otherwise clear Cardiac regular rate and rhythm Incisions well-healed  Diagnostic Tests: CHEST - 2 VIEW  COMPARISON:  In 22  FINDINGS: The heart size and mediastinal contours are within normal limits. There is a stable left upper lobectomy. A left pleural effusion is again identified. The right lung is clear. No acute osseous abnormality.  IMPRESSION: Stable postsurgical changes in the left lung.  Left pleural effusion   Electronically Signed   By: Taylor Bailey M.D.   On: 12/15/2020 15:12 I personally reviewed the chest x-ray images.  There are postoperative changes on the left with unchanged triangular-shaped fluid collection.  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  Her carcinoid tumor was discovered on a CT of the chest for  coronary calcium score.  She had a robotic left upper lobectomy and node dissection on 10/01/2020.  She is doing well.  She did tolerate the prednisone without difficulty, but it may no difference in her chest x-ray.  I think this is just fluid filling some of the space left behind by the upper lobectomy.  I do not think any further intervention is warranted.  She is scheduled to have a CT with Dr. Julien Bailey in September.  I will plan to see her back after that just to check on her progress.  Plan: Follow-up with Dr. Julien Bailey as scheduled Return in 64months for follow-up  Taylor Nakayama, MD [facility] [phone]

## 2021-01-11 ENCOUNTER — Other Ambulatory Visit: Payer: Self-pay

## 2021-01-11 ENCOUNTER — Ambulatory Visit
Admission: RE | Admit: 2021-01-11 | Discharge: 2021-01-11 | Disposition: A | Discharge status: Home / Self Care | Payer: Medicare Other | Source: Ambulatory Visit | Attending: Obstetrics & Gynecology | Admitting: Obstetrics & Gynecology

## 2021-01-11 DIAGNOSIS — Z1231 Encounter for screening mammogram for malignant neoplasm of breast: Secondary | ICD-10-CM

## 2021-04-13 ENCOUNTER — Encounter: Payer: Self-pay | Admitting: Family Medicine

## 2021-04-13 ENCOUNTER — Ambulatory Visit (INDEPENDENT_AMBULATORY_CARE_PROVIDER_SITE_OTHER): Payer: Medicare Other | Admitting: Family Medicine

## 2021-04-13 ENCOUNTER — Other Ambulatory Visit: Payer: Self-pay

## 2021-04-13 VITALS — BP 137/83 | HR 73 | Temp 97.2°F | Ht 65.0 in | Wt 159.2 lb

## 2021-04-13 DIAGNOSIS — N390 Urinary tract infection, site not specified: Secondary | ICD-10-CM | POA: Diagnosis not present

## 2021-04-13 DIAGNOSIS — R300 Dysuria: Secondary | ICD-10-CM

## 2021-04-13 LAB — POC URINALSYSI DIPSTICK (AUTOMATED)
Bilirubin, UA: NEGATIVE
Blood, UA: NEGATIVE
Glucose, UA: NEGATIVE
Ketones, UA: NEGATIVE
Leukocytes, UA: NEGATIVE
Nitrite, UA: NEGATIVE
Protein, UA: NEGATIVE
Spec Grav, UA: <=1.005 — AB (ref 1.010–1.025)
Urobilinogen, UA: 0.2 E.U./dL
pH, UA: 6 (ref 5.0–8.0)

## 2021-04-13 MED ORDER — CEPHALEXIN 500 MG PO CAPS: 500.0000 mg | ORAL_CAPSULE | Freq: Three times a day (TID) | ORAL | 0 refills | Status: DC

## 2021-04-13 NOTE — Progress Notes (Signed)
Phone [phone] In person visit   Subjective:   Taylor Bailey is a 75 y.o. year old very pleasant female patient who presents for/with See problem oriented charting Chief Complaint  Patient presents with   Urinary Frequency    Burning,flank pain,urgency x 5 days    This visit occurred during the SARS-CoV-2 public health emergency.  Safety protocols were in place, including screening questions prior to the visit, additional usage of staff PPE, and extensive cleaning of exam room while observing appropriate contact time as indicated for disinfecting solutions.   Past Medical History-  Patient Active Problem List   Diagnosis Date Noted   Carcinoid tumor of left lung 09/08/2020    Priority: High   Hyperlipidemia, unspecified 04/02/2019    Priority: Medium   Osteoporosis 10/2017    Priority: Medium   S/P lobectomy of lung 10/01/2020    Priority: Low   Vitamin D deficiency     Priority: Low    Medications- reviewed and updated Current Outpatient Medications  Medication Sig Dispense Refill   cephALEXin (KEFLEX) 500 MG capsule Take 1 capsule (500 mg total) by mouth 3 (three) times daily for 7 days. 21 capsule 0   Cholecalciferol (VITAMIN D) 50 MCG (2000 UT) CAPS Take 2,000 Units by mouth daily. Gummie     fluticasone (FLONASE) 50 MCG/ACT nasal spray Place 2 sprays into both nostrils daily as needed for rhinitis.     No current facility-administered medications for this visit.     Objective:  BP 137/83   Pulse 73   Temp (!) 97.2 F (36.2 C)   Ht 5\' 5"  (1.651 m)   Wt 159 lb 3.2 oz (72.2 kg)   SpO2 99%   BMI 26.49 kg/m  Gen: NAD, resting comfortable CV: RRR no murmurs rubs or gallops Lungs: CTAB no crackles, wheeze, rhonchi Abdomen: soft/nontender/nondistended/normal bowel sounds. No rebound or guarding.  Ext: no edema Mild low back pain with palpation Skin: warm, dry  Results for orders placed or performed in visit on 04/13/21 (from the past 24 hour(s))   POCT Urinalysis Dipstick (Automated)     Status: Abnormal   Collection Time: 04/13/21  3:35 PM  Result Value Ref Range   Color, UA straw    Clarity, UA clear    Glucose, UA Negative Negative   Bilirubin, UA negative    Ketones, UA negative    Spec Grav, UA <=1.005 (A) 1.010 - 1.025   Blood, UA negative    pH, UA 6.0 5.0 - 8.0   Protein, UA Negative Negative   Urobilinogen, UA 0.2 0.2 or 1.0 E.U./dL   Nitrite, UA negative    Leukocytes, UA Negative Negative      Assessment and Plan  # Urinary Tract Infection S:Patient presents today with a Possible UTI    About 5 days ago- Saturday evening was feeling really sore from left upper abdomen around to left thoracic back- nothing on the right side- pain radiates into low back. No rash. Also has burning sensation when urinating. More frequent urination but drinks 60 oz of water a day. No increased nocturia. Increased urgency. Symptoms worsening in last few days. With higher humidity breathing feels more labored- no recent increased SOB. She is a carcinoid tumor of left lung survivor s/p lobectomy left upper lobe  Patient had an eye infection on 7/6 on doxycycline for 10 days twice daily. No other recent antibiotics- but has had recurrent issues with the ROS- no fever, chills, nausea, vomiting, flank pain.  No blood in urine.  A/P: UA not overly concerning but well hydrated- could have diluted urine. Based off symptoms sounds like UTI. Will get culture. Empiric treatment with: keflex Patient to follow up if new or worsening symptoms or failure to improve.  - in regards to LUQ/left thoracic pain offered CXR (does have pleural effusion history) but has imaging later this month and wants to hold off for now -she will certainly let us know if fails to improve  Recommended follow up: keep December visit unless needs to see me sooner Future Appointments  Date Time Provider [place]  05/11/2021 10:30 AM [facility] None   05/13/2021 10:30 AM Curt Bears, MD [facility] None  05/25/2021 11:30 AM Melrose Nakayama, MD [facility]  08/30/2021  9:20 AM [name], MD [facility]   Lab/Order associations:   ICD-10-CM   1. Dysuria  R30.0     2. Urinary tract infection without hematuria, site unspecified  N39.0 POCT Urinalysis Dipstick (Automated)    Urine Culture     Meds ordered this encounter  Medications   cephALEXin (KEFLEX) 500 MG capsule    Sig: Take 1 capsule (500 mg total) by mouth 3 (three) times daily for 7 days.    Dispense:  21 capsule    Refill:  0   I,Harris Phan,acting as a scribe for Garret Reddish, MD.,have documented all relevant documentation on the behalf of Garret Reddish, MD,as directed by  Garret Reddish, MD while in the presence of Garret Reddish, MD.  I, Garret Reddish, MD, have reviewed all documentation for this visit. The documentation on 04/13/21 for the exam, diagnosis, procedures, and orders are all accurate and complete.    Return precautions advised.  Garret Reddish, MD

## 2021-04-13 NOTE — Patient Instructions (Addendum)
Let us know if not improving in regards to burning with peeing, pain in back and upper abdomen -discussed possible chest x-ray but you wanted to hold of ffor now  Take antibiotics for full 7 days  Recommended follow up: keep December visit unless you have new or worsening symptoms or symptoms fail to resolve

## 2021-04-14 LAB — URINE CULTURE
MICRO NUMBER:: 12191439
Result:: NO GROWTH
SPECIMEN QUALITY:: ADEQUATE

## 2021-04-20 ENCOUNTER — Other Ambulatory Visit: Payer: Self-pay

## 2021-04-20 ENCOUNTER — Encounter: Payer: Self-pay | Admitting: Family Medicine

## 2021-04-20 ENCOUNTER — Ambulatory Visit (INDEPENDENT_AMBULATORY_CARE_PROVIDER_SITE_OTHER): Payer: Medicare Other | Admitting: Family Medicine

## 2021-04-20 VITALS — BP 147/85 | HR 78 | Temp 98.1°F | Ht 65.0 in | Wt 157.6 lb

## 2021-04-20 DIAGNOSIS — R109 Unspecified abdominal pain: Secondary | ICD-10-CM | POA: Diagnosis not present

## 2021-04-20 DIAGNOSIS — R030 Elevated blood-pressure reading, without diagnosis of hypertension: Secondary | ICD-10-CM

## 2021-04-20 DIAGNOSIS — M546 Pain in thoracic spine: Secondary | ICD-10-CM

## 2021-04-20 NOTE — Patient Instructions (Addendum)
Health Maintenance Due  Topic Date Due   TETANUS/TDAP Will call back with her date.  Never done   Zoster Vaccines- Shingrix (1 of 2) if you would like  Please check with your pharmacy to see if they have the shingrix vaccine. If they do- please get this immunization and update [place] by phone call or mychart with dates you receive the vaccine  Never done   Prevnar 20 - wait until side feels better or give in december Never done   COVID-19 Vaccine - wants to wait until new variant specific 10/16/2020   Glad urinary symptoms are better- let us know if these recur  Since you have scan on august 29th we opted to hold off on imaging (considered chest x-ray or even thoracic spine films) but this will get picked up better most likely on CT. I will message Dr. Earlie Server. We can change our plan if symptoms worsen  Try a heating pad perhaps 15-20 minutes - can continue if effective.   Recommended follow up: keep December visit or sooner if needed

## 2021-04-20 NOTE — Progress Notes (Signed)
Phone [phone] In person visit   Subjective:   Taylor Bailey is a 75 y.o. year old very pleasant female patient who presents for/with See problem oriented charting Chief Complaint  Patient presents with   Flank Pain    Left side pain where her lung was removed.     This visit occurred during the SARS-CoV-2 public health emergency.  Safety protocols were in place, including screening questions prior to the visit, additional usage of staff PPE, and extensive cleaning of exam room while observing appropriate contact time as indicated for disinfecting solutions.   Past Medical History-  Patient Active Problem List   Diagnosis Date Noted   Carcinoid tumor of left lung 09/08/2020    Priority: High   Hyperlipidemia, unspecified 04/02/2019    Priority: Medium   Osteoporosis 10/2017    Priority: Medium   S/P lobectomy of lung 10/01/2020    Priority: Low   Vitamin D deficiency     Priority: Low    Medications- reviewed and updated Current Outpatient Medications  Medication Sig Dispense Refill   Cholecalciferol (VITAMIN D) 50 MCG (2000 UT) CAPS Take 2,000 Units by mouth daily. Gummie     fluticasone (FLONASE) 50 MCG/ACT nasal spray Place 2 sprays into both nostrils daily as needed for rhinitis.     No current facility-administered medications for this visit.     Objective:  BP (!) 147/85   Pulse 78   Temp 98.1 F (36.7 C) (Temporal)   Ht 5\' 5"  (1.651 m)   Wt 157 lb 9.6 oz (71.5 kg)   SpO2 97%   BMI 26.23 kg/m  Gen: NAD, resting comfortably, well-appearing CV: RRR no murmurs rubs or gallops Lungs: CTAB no crackles, wheeze, rhonchi Abdomen: soft/nontender/nondistended/normal bowel sounds. No rebound or guarding.  Patient does have some tenderness over left-sided surgical scar site Ext: no edema Skin: warm, dry    Assessment and Plan   # Left upper abdomen pain/ left thoracic back pain radiating into lower back -UTI symptoms resolved S:Patient seen last  week with concern for UTI-UA was not overly concerning and culture ultimately grew no bacteria.  We did empirically treat with Keflex-patient reports no improvement in her symptoms of burning with urination.  No nocturia.  Did have some increased urgency  Patient also prior to August 2 visit had about 5 days of symptoms of soreness in left upper abdomen around to left thoracic back-no rash in this area.  With higher humidity was feeling some slightly more labored breathing but no increased shortness of breath from baseline-history of carcinoid tumor of left lung status post lobectomy left upper lobe.  We had discussed getting a chest x-ray-history of pleural effusion the patient initially wanted to hold off- She does have an upcoming CT of her chest with oncology on august 30th. Better with laying down- states pain resolves. Also doesn't have pain with walking.   Today, she reports pain in her back is 1-2/10 perhaps maybe 2-3/10. Very manageable- not taking medicine right now other than ibuprofen yesterday when worked in her yard. Pain is around prior surgical site- could have some potential scar tissue. Yesterday was really active in her yard and when she was actively working/warm she actually felt pain improved.   She states urinary symptoms have resolved on keflex even though culture negative.  A/P: Patient's left upper abdominal pain and left side pain seems to be most focused about surgical scar site.  I suspect this is either musculoskeletal or scar tissue  related.  Since her pain is improving she would like to hold off on imaging particularly since she has a CT scan of her chest within 20 days.  Since her pain wraps around to her thoracic spine considered normal on chest x-ray but also thoracic spine films-could even consider lumbar spine films if needed in the future.  She would like hold off unless symptoms fail to improve.  She did request that I update Dr. Earlie Server and I did send him a message this  evening.  CT of chest we will get some limited imaging of thoracic spine and left upper quadrant.  I doubt this is related to prior carcinoid tumor status post lobectomy at present  #Mildly elevated blood pressure-if blood pressure elevated again at next visit we will consider home monitoring.  No history of hypertension  Recommended follow up: Keep December visit or sooner if needed Future Appointments  Date Time Provider [place]  05/11/2021 10:30 AM [facility] None  05/13/2021 10:30 AM Curt Bears, MD [facility] None  05/25/2021 11:30 AM Melrose Nakayama, MD [facility]  08/30/2021  9:20 AM [name], MD [facility]   Lab/Order associations:   ICD-10-CM   1. Left sided abdominal pain  R10.9     2. Acute left-sided thoracic back pain  M54.6     3. Elevated blood pressure reading without diagnosis of hypertension  R03.0       Time Spent: 13 minutes of total time (4:29 PM-4:42 PM) was spent on the date of the encounter performing the following actions: chart review prior to seeing the patient, obtaining history, performing a medically necessary exam, counseling on the treatment plan, placing orders, and documenting in our EHR.     Return precautions advised.  Garret Reddish, MD

## 2021-05-11 ENCOUNTER — Inpatient Hospital Stay: Payer: Medicare Other | Attending: Internal Medicine

## 2021-05-11 ENCOUNTER — Other Ambulatory Visit: Payer: Self-pay

## 2021-05-11 ENCOUNTER — Ambulatory Visit (HOSPITAL_COMMUNITY)
Admission: RE | Admit: 2021-05-11 | Discharge: 2021-05-11 | Disposition: A | Discharge status: Home / Self Care | Payer: Medicare Other | Source: Ambulatory Visit | Attending: Internal Medicine | Admitting: Internal Medicine

## 2021-05-11 DIAGNOSIS — D3A090 Benign carcinoid tumor of the bronchus and lung: Secondary | ICD-10-CM

## 2021-05-11 DIAGNOSIS — C7A090 Malignant carcinoid tumor of the bronchus and lung: Secondary | ICD-10-CM | POA: Diagnosis not present

## 2021-05-11 DIAGNOSIS — C3490 Malignant neoplasm of unspecified part of unspecified bronchus or lung: Secondary | ICD-10-CM

## 2021-05-11 LAB — CBC WITH DIFFERENTIAL (CANCER CENTER ONLY)
Abs Immature Granulocytes: 0.01 10*3/uL (ref 0.00–0.07)
Basophils Absolute: 0 10*3/uL (ref 0.0–0.1)
Basophils Relative: 1 %
Eosinophils Absolute: 0.1 10*3/uL (ref 0.0–0.5)
Eosinophils Relative: 2 %
HCT: 38.5 % (ref 36.0–46.0)
Hemoglobin: 12.5 g/dL (ref 12.0–15.0)
Immature Granulocytes: 0 %
Lymphocytes Relative: 30 %
Lymphs Abs: 1.7 10*3/uL (ref 0.7–4.0)
MCH: 28 pg (ref 26.0–34.0)
MCHC: 32.5 g/dL (ref 30.0–36.0)
MCV: 86.3 fL (ref 80.0–100.0)
Monocytes Absolute: 0.6 10*3/uL (ref 0.1–1.0)
Monocytes Relative: 11 %
Neutro Abs: 3.3 10*3/uL (ref 1.7–7.7)
Neutrophils Relative %: 56 %
Platelet Count: 236 10*3/uL (ref 150–400)
RBC: 4.46 MIL/uL (ref 3.87–5.11)
RDW: 14.5 % (ref 11.5–15.5)
WBC Count: 5.7 10*3/uL (ref 4.0–10.5)
nRBC: 0 % (ref 0.0–0.2)

## 2021-05-11 LAB — CMP (CANCER CENTER ONLY)
ALT: 14 U/L (ref 0–44)
AST: 18 U/L (ref 15–41)
Albumin: 4 g/dL (ref 3.5–5.0)
Alkaline Phosphatase: 70 U/L (ref 38–126)
Anion gap: 9 (ref 5–15)
BUN: 9 mg/dL (ref 8–23)
CO2: 26 mmol/L (ref 22–32)
Calcium: 9.6 mg/dL (ref 8.9–10.3)
Chloride: 106 mmol/L (ref 98–111)
Creatinine: 0.74 mg/dL (ref 0.44–1.00)
GFR, Estimated: >60 mL/min (ref >60)
Glucose, Bld: 90 mg/dL (ref 70–99)
Potassium: 4.4 mmol/L (ref 3.5–5.1)
Sodium: 141 mmol/L (ref 135–145)
Total Bilirubin: 0.5 mg/dL (ref 0.3–1.2)
Total Protein: 7.2 g/dL (ref 6.5–8.1)

## 2021-05-11 MED ORDER — IOHEXOL 350 MG/ML SOLN
75.0000 mL | Freq: Once | INTRAVENOUS | Status: AC | PRN
  Administered 2021-05-11: 75 mL via INTRAVENOUS

## 2021-05-13 ENCOUNTER — Other Ambulatory Visit: Payer: Self-pay

## 2021-05-13 ENCOUNTER — Inpatient Hospital Stay: Payer: Medicare Other | Attending: Internal Medicine | Admitting: Internal Medicine

## 2021-05-13 VITALS — BP 149/88 | HR 79 | Temp 97.7°F | Resp 20 | Ht 65.0 in | Wt 158.7 lb

## 2021-05-13 DIAGNOSIS — M810 Age-related osteoporosis without current pathological fracture: Secondary | ICD-10-CM | POA: Diagnosis not present

## 2021-05-13 DIAGNOSIS — E559 Vitamin D deficiency, unspecified: Secondary | ICD-10-CM | POA: Diagnosis not present

## 2021-05-13 DIAGNOSIS — R911 Solitary pulmonary nodule: Secondary | ICD-10-CM | POA: Diagnosis not present

## 2021-05-13 DIAGNOSIS — D3A090 Benign carcinoid tumor of the bronchus and lung: Secondary | ICD-10-CM

## 2021-05-13 DIAGNOSIS — C7A090 Malignant carcinoid tumor of the bronchus and lung: Secondary | ICD-10-CM | POA: Insufficient documentation

## 2021-05-13 DIAGNOSIS — J90 Pleural effusion, not elsewhere classified: Secondary | ICD-10-CM | POA: Diagnosis not present

## 2021-05-13 DIAGNOSIS — Z85828 Personal history of other malignant neoplasm of skin: Secondary | ICD-10-CM | POA: Insufficient documentation

## 2021-05-13 DIAGNOSIS — C3490 Malignant neoplasm of unspecified part of unspecified bronchus or lung: Secondary | ICD-10-CM

## 2021-05-13 NOTE — Progress Notes (Signed)
[place] [phone]   [phone]  OFFICE PROGRESS NOTE  [name], MD [place] [address]  DIAGNOSIS: Stage IIA (T1 a, N1, M0) low-grade neuroendocrine carcinoma, carcinoid tumor diagnosed in January 2022  PRIOR THERAPY: Status post left upper lobectomy with lymph node dissection under the care of Dr. Roxan Hockey on October 01, 2020.  CURRENT THERAPY: Observation  INTERVAL HISTORY: Taylor Bailey 75 y.o. female returns to the clinic today for 69-month follow-up visit.  The patient is feeling fine today with no concerning complaints except for intermittent pain on the left side of the chest from the surgical scar.  She denied having any shortness of breath, cough or hemoptysis.  She denied having any fever or chills.  She has no nausea, vomiting, diarrhea or constipation.  She has no headache or visual changes.  She is here today for evaluation with repeat CT scan of the chest for restaging of her disease.  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  MEDICATIONS:  Current Outpatient Medications  Medication Sig Dispense Refill   Cholecalciferol (VITAMIN D) 50 MCG (2000 UT) CAPS Take 2,000 Units by mouth daily. Gummie     fluticasone (FLONASE) 50 MCG/ACT nasal spray Place 2 sprays into both nostrils daily as needed for rhinitis.     No current facility-administered medications for this visit.    SURGICAL HISTORY:  Past Surgical History:  Procedure Laterality Date   BREAST BIOPSY Left    BENIGN   BREAST EXCISIONAL BIOPSY  Left    BRONCHIAL NEEDLE ASPIRATION BIOPSY  08/28/2020   Procedure: BRONCHIAL NEEDLE ASPIRATION BIOPSIES;  Surgeon: Garner Nash, DO;  Location: [facility];  Service: Pulmonary;;   CATARACT EXTRACTION, BILATERAL  2018   COLONOSCOPY     INTERCOSTAL NERVE BLOCK Left 10/01/2020   Procedure: INTERCOSTAL NERVE BLOCK;  Surgeon: Melrose Nakayama, MD;  Location: [facility];  Service: Thoracic;  Laterality: Left;   IR THORACENTESIS ASP PLEURAL SPACE W/IMG GUIDE  10/30/2020   NODE DISSECTION Left 10/01/2020   Procedure: NODE DISSECTION;  Surgeon: Melrose Nakayama, MD;  Location: [facility];  Service: Thoracic;  Laterality: Left;   TUBAL LIGATION     VIDEO BRONCHOSCOPY WITH ENDOBRONCHIAL ULTRASOUND Bilateral 08/28/2020   Procedure: VIDEO BRONCHOSCOPY WITH ENDOBRONCHIAL ULTRASOUND;  Surgeon: Garner Nash, DO;  Location: [facility];  Service: Pulmonary;  Laterality: Bilateral;   WISDOM TOOTH EXTRACTION      REVIEW OF SYSTEMS:  A comprehensive review of systems was negative.   PHYSICAL EXAMINATION: General appearance: alert, cooperative, fatigued, and no distress Head: Normocephalic, without obvious abnormality, atraumatic Neck: no adenopathy, no JVD, supple, symmetrical, trachea midline, and thyroid not enlarged, symmetric, no tenderness/mass/nodules Lymph nodes: Cervical, supraclavicular, and axillary nodes normal. Resp: clear to auscultation bilaterally Back: symmetric, no curvature. ROM normal. No CVA tenderness. Cardio: regular rate and rhythm, S1, S2 normal, no murmur, click, rub or gallop GI: soft, non-tender; bowel sounds normal; no masses,  no organomegaly Extremities: extremities normal,  atraumatic, no cyanosis or edema  ECOG PERFORMANCE STATUS: 1 - Symptomatic but completely ambulatory  Blood pressure (!) 149/88, pulse 79, temperature 97.7 F (36.5 C), temperature source Tympanic, resp. rate 20, height 5\' 5"  (1.651 m), weight 158 lb 11.2 oz (72 kg), SpO2 99 %.  LABORATORY  DATA: Lab Results  Component Value Date   WBC 5.7 05/11/2021   HGB 12.5 05/11/2021   HCT 38.5 05/11/2021   MCV 86.3 05/11/2021   PLT 236 05/11/2021      Chemistry      Component Value Date/Time   NA 141 05/11/2021 1018   K 4.4 05/11/2021 1018   CL 106 05/11/2021 1018   CO2 26 05/11/2021 1018   BUN 9 05/11/2021 1018   CREATININE 0.74 05/11/2021 1018      Component Value Date/Time   CALCIUM 9.6 05/11/2021 1018   ALKPHOS 70 05/11/2021 1018   AST 18 05/11/2021 1018   ALT 14 05/11/2021 1018   BILITOT 0.5 05/11/2021 1018       RADIOGRAPHIC STUDIES: CT Chest W Contrast  Result Date: 05/12/2021 CLINICAL DATA:  Primary Cancer Type: Lung Imaging Indication: Assess response to therapy Interval therapy since last imaging?  Yes; resection Initial Cancer Diagnosis Date: 08/28/2020; Established by: Biopsy-proven Detailed Pathology: Stage IIA low-grade neuroendocrine carcinoma, carcinoid tumor. Primary Tumor location: Left upper lobe. Surgeries: Left upper lobectomy 10/01/2020. Chemotherapy: No Immunotherapy? No Radiation therapy? No EXAM: CT CHEST WITH CONTRAST TECHNIQUE: Multidetector CT imaging of the chest was performed during intravenous contrast administration. CONTRAST:  50mL OMNIPAQUE IOHEXOL 350 MG/ML SOLN COMPARISON:  Most recent CT chest 08/19/2020.  09/03/2020 PET-CT. FINDINGS: Cardiovascular: Shift of mediastinal structures into the LEFT chest following partial lung resection, LEFT upper lobectomy. Signs of aortic atherosclerosis. No aneurysmal dilation of the thoracic aorta. Stable heart size. No substantial pericardial effusion. Central pulmonary vasculature unremarkable on venous phase assessment aside from postoperative changes about the LEFT hilum related to LEFT upper lobectomy. Mediastinum/Nodes: Esophagus grossly normal. No signs of thoracic inlet, axillary or mediastinal lymphadenopathy. Lungs/Pleura: Interval LEFT upper lobectomy. Small LEFT pleural effusion at the LEFT lung  apex and along the posterior LEFT chest inferiorly. No signs of pleural nodularity. Airways are patent. Stable 5 mm RIGHT lower lobe pulmonary nodule. Upper Abdomen: Imaged portions the liver, pancreas, spleen, adrenal glands and kidneys are unremarkable. Signs of small hiatal hernia. Musculoskeletal: Spinal degenerative changes. No acute or destructive bone finding. IMPRESSION: Interval LEFT upper lobectomy.  No signs of disease recurrence. Small LEFT pleural effusion. Stable 5 mm RIGHT lower lobe pulmonary nodule with ground-glass features. Suggest continued attention on follow-up. Aortic Atherosclerosis (ICD10-I70.0). Electronically Signed   By: Zetta Bills M.D.   On: 05/12/2021 11:35     ASSESSMENT AND PLAN: This is a very pleasant 75 years old white female recently diagnosed with a stage IIA (T1 a, N1, M0) low-grade neuroendocrine carcinoma, carcinoid tumor diagnosed in January 2022 status post left lower lobectomy with lymph node dissection under the care of Dr. Roxan Hockey on October 01, 2020. The patient is currently on observation and she is feeling fine today with no concerning complaints.  She is very active and walk around 3 miles every day. She had repeat CT scan of the chest performed recently.  I personally and independently reviewed the scan and discussed the results with the patient today. Her scan showed no concerning findings for disease recurrence or metastasis. I recommended for her to continue on observation with repeat CT scan of the chest in 6  months. She was advised to call immediately if she has any other concerning symptoms in the interval. The patient voices understanding of current disease status and treatment options and is in agreement with the current care plan.  All questions were answered. The patient knows to call the clinic with any problems, questions or concerns. We can certainly see the patient much sooner if necessary.  Disclaimer: This note was dictated with  voice recognition software. Similar sounding words can inadvertently be transcribed and may not be corrected upon review.

## 2021-05-25 ENCOUNTER — Ambulatory Visit: Payer: Medicare Other | Admitting: Thoracic Surgery (Cardiothoracic Vascular Surgery)

## 2021-07-26 HISTORY — PX: OTHER SURGICAL HISTORY: SHX169

## 2021-08-23 NOTE — Progress Notes (Signed)
Phone [phone]   Subjective:  Patient presents today for their annual exam/counseling. Chief complaint-noted.   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   Chicken pox    Complication of anesthesia 2015   Colonoscopy   Osteoporosis 10/2017   T score -2.9   Vitamin D deficiency    takes 2000 units each day   Patient Active Problem List   Diagnosis Date Noted   Carcinoid tumor of left lung 09/08/2020    Priority: High   Aortic atherosclerosis ([place]) 08/30/2021    Priority: Medium    Hyperlipidemia, unspecified 04/02/2019    Priority: Medium    Osteoporosis 10/2017    Priority: Medium    S/P lobectomy of lung 10/01/2020    Priority: Low   Vitamin D deficiency     Priority: Low   Past Surgical History:  Procedure Laterality Date   basal cell carcinoma removal  07/26/2021   of nose   BREAST BIOPSY Left    BENIGN   BREAST EXCISIONAL BIOPSY Left    BRONCHIAL NEEDLE ASPIRATION BIOPSY  08/28/2020   Procedure: BRONCHIAL NEEDLE ASPIRATION BIOPSIES;  Surgeon: Garner Nash, DO;  Location: [facility];  Service: Pulmonary;;   CATARACT EXTRACTION, BILATERAL  2018   COLONOSCOPY     INTERCOSTAL NERVE BLOCK Left 10/01/2020   Procedure: INTERCOSTAL NERVE BLOCK;  Surgeon: Melrose Nakayama, MD;  Location: [facility];  Service: Thoracic;  Laterality: Left;   IR THORACENTESIS ASP PLEURAL SPACE W/IMG GUIDE  10/30/2020   NODE DISSECTION Left 10/01/2020   Procedure: NODE DISSECTION;  Surgeon: Melrose Nakayama, MD;  Location: [facility];  Service: Thoracic;  Laterality: Left;   TUBAL LIGATION     VIDEO BRONCHOSCOPY WITH ENDOBRONCHIAL ULTRASOUND Bilateral 08/28/2020    Procedure: VIDEO BRONCHOSCOPY WITH ENDOBRONCHIAL ULTRASOUND;  Surgeon: Garner Nash, DO;  Location: [facility];  Service: Pulmonary;  Laterality: Bilateral;   WISDOM TOOTH EXTRACTION      Family History  Problem Relation Age of Onset   Breast cancer Mother        lived to 74. relatives have lived in 34s and 81s   Diabetes Maternal Grandmother    Other Father        black lung- lived to 96   Healthy Sister    Colon cancer Neg Hx    Pancreatic cancer Neg Hx    Stomach cancer Neg Hx     Medications- reviewed and updated Current Outpatient Medications  Medication Sig Dispense Refill   Cholecalciferol (VITAMIN D) 50 MCG (2000 UT) CAPS Take 2,000 Units by mouth daily. Gummie     fluticasone (FLONASE) 50 MCG/ACT nasal spray Place 2 sprays into both nostrils daily as needed for rhinitis.     No current facility-administered medications for this visit.    Allergies-reviewed and updated Allergies  Allergen Reactions   Fluoride Preparations     FLU VACCINE   Influenza Vaccines     Hives, angioedema- monitored in office on benadryl (received at [facility])    Prednisone     Bad shakes/hyper after 2 days. Not sure about injections     Social History   Social History Narrative  Widowed in 2013- multiple medical conditions. 2 children- son married with 2 kids (boy 2, daughter 48) and daughter in commercial real estate (age 61) 26.       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 Oropharynx normal Neck: no thyromegaly CV: RRR no murmurs rubs or gallops Lungs: CTAB other than inconsistent wheeze Abdomen: soft/nontender/nondistended/normal bowel sounds. No rebound or guarding.  Ext: no edema Skin: warm, dry Neuro: grossly normal, moves all extremities, PERRLAg   Assessment and Plan   75 y.o. female presenting for annual physical.  Health Maintenance counseling: 1. Anticipatory guidance: Patient counseled regarding regular dental exams -q6 months, eye exams - yearly,  avoiding smoking and second hand smoke , limiting alcohol to 1 beverage per day.  No illicit drugs  2. Risk factor reduction:  Advised patient of need for regular exercise and diet rich and fruits and vegetables to reduce risk of heart attack and stroke. Exercise- 20-25 miles a week.  Diet/weight management-stable weight- she may push to get to 145-150- recommended at least maintaining - right now she tries to stay 153-157 on home scales Wt Readings from Last 3 Encounters:  08/30/21 158 lb (71.7 kg)  05/13/21 158 lb 11.2 oz (72 kg)  04/20/21 157 lb 9.6 oz (71.5 kg)  3. Immunizations/screenings/ancillary studies DISCUSSED:  -COVID booster vaccination #4-  bivalent recommended, declines for now- using kn95- stays out of crowrds -Shingrix vaccination #1-  recommended at pharmacy- she wants to hold off -TDAP vaccination -  2017 with [facility] -Prevnar-20 vaccination #1-  she declines as prior severe flu reaction Immunization History  Administered Date(s) Administered   Moderna Sars-Covid-2 Vaccination 11/13/2019, 12/11/2019, 07/16/2020  4. Cervical cancer screening- past age based screening recommendations but still sees GYN 5. Breast cancer screening-  breast exam-offered clinical breast exam-prefers to see gyn and mammogram 01/11/21 with 1 year repeat planned  6. Colon cancer screening - 11/22/13 with 10 year repeat planned 7. Skin cancer screening- basal off of nose recently  with dermatology with Dr. Delman Cheadle. advised regular sunscreen use. Denies worrisome, changing, or new skin lesions.  8. Birth control/STD check- postmenopausal.  Not sexually active. 9. Osteoporosis screening at 30- DEXA 10/24/17-osteoporosis noted see discussion below -never smoker  Status of chronic or acute concerns   #History of carcinoid lung cancer-with history of left upper lobectomy -She does have some lingering neuropathic pain after thoracic surgery and can last for years.  Dr. Earlie Server has recommended Tylenol or occasional ibuprofen if needed.  Gabapentin if absolutely necessary. Gets issues with long distance driving- if not doing long drives seems to do ok- did get someone better after UTI treatment (perhaps some renal involvement as did improve with antibiotics) -Most recent scan 05/11/2021 with no signs of recurrence.  Did have a very small left pleural effusion.  Stable 5 mm right lower lobe pulmonary nodule with plan for continued follow-up.  Next scan in February planned.   #hyperlipidemia/aortic atherosclerosis S: Medication: None Lab Results  Component Value Date   CHOL 191 04/02/2019   HDL 60.50 04/02/2019   LDLCALC 110 (H) 04/02/2019   TRIG 105.0 04/02/2019   CHOLHDL 3 04/02/2019   A/P: patient with longevity in family. She is eating well and exercising- prefers to remain off statin unless significant worsening of #s- thankful no plaque on heart noted on CT as well- aortic atherosclerosis is a risk factor for heart attack or storke and if those happen we certainly will change our plan  #Osteoporosis with GYN S: Medication: 2000 units/day with vitamin -With osteoporosis also takes calcium -doing walking for weight bearing exercise as well- had been doing weight lifting in gym before covid -fosamax 5-7 years through Dr. Jake Michaelis through [facility]- stopped about 10 years ago.  - last scan 2019 with GYN- was told to consider reclast or prolia but she wanted to hold off - has  friend who is English as a second language teacher Last vitamin D Lab Results  Component Value Date   VD25OH 51.50 04/02/2019  A/P: she plans to reach out to GYN to discuss repeat - continue calcium and vitamin D for now and weight bearing exercise.  -fosamax over 10 years  #Left ear itching-notes about 3 days of symptoms-denies pain  Recommended follow up: No follow-ups on file.  Future Appointments  Date Time Provider [place]  11/08/2021 10:30 AM [facility] None  11/10/2021  1:45 PM Curt Bears, MD [facility] None   Lab/Order associations: fasting   ICD-10-CM   1. Hyperlipidemia, unspecified hyperlipidemia type  E78.5 CBC with Differential/Platelet    Comprehensive metabolic panel    Lipid panel    2. Age-related osteoporosis without current pathological fracture  M81.0 VITAMIN D 25 Hydroxy (Vit-D Deficiency, Fractures)    3. Vitamin D deficiency  E55.9 VITAMIN D 25 Hydroxy (Vit-D Deficiency, Fractures)    4. Aortic atherosclerosis ([facility])  I70.0      Time Spent: 34 minutes of total time (9: 10 AM- 9: 44 AM) was spent on the date of the encounter performing the following actions: chart review prior to seeing the patient, obtaining history, performing a medically necessary exam, counseling on the treatment plan, placing orders, and documenting in our EHR.   I,Jada Bradford,acting as a scribe for Garret Reddish, MD.,have documented all relevant documentation on the behalf of Garret Reddish, MD,as directed by  Garret Reddish, MD while in the presence of Garret Reddish, MD.  I, Garret Reddish, MD, have reviewed all documentation for this visit. The documentation on 08/30/21 for the exam, diagnosis, procedures, and orders are all accurate and complete.  Return precautions advised.  Garret Reddish, MD

## 2021-08-30 ENCOUNTER — Encounter: Payer: Self-pay | Admitting: Family Medicine

## 2021-08-30 ENCOUNTER — Ambulatory Visit (INDEPENDENT_AMBULATORY_CARE_PROVIDER_SITE_OTHER): Payer: Medicare Other | Admitting: Family Medicine

## 2021-08-30 VITALS — BP 118/78 | HR 83 | Temp 97.9°F | Ht 65.0 in | Wt 158.0 lb

## 2021-08-30 DIAGNOSIS — E785 Hyperlipidemia, unspecified: Secondary | ICD-10-CM | POA: Diagnosis not present

## 2021-08-30 DIAGNOSIS — I700 Atherosclerosis of aorta: Secondary | ICD-10-CM | POA: Diagnosis not present

## 2021-08-30 DIAGNOSIS — Z0000 Encounter for general adult medical examination without abnormal findings: Secondary | ICD-10-CM

## 2021-08-30 DIAGNOSIS — E559 Vitamin D deficiency, unspecified: Secondary | ICD-10-CM | POA: Diagnosis not present

## 2021-08-30 DIAGNOSIS — M810 Age-related osteoporosis without current pathological fracture: Secondary | ICD-10-CM | POA: Diagnosis not present

## 2021-08-30 LAB — LIPID PANEL
Cholesterol: 235 mg/dL — ABNORMAL HIGH (ref 0–200)
HDL: 77.5 mg/dL (ref >39.00)
LDL Cholesterol: 143 mg/dL — ABNORMAL HIGH (ref 0–99)
NonHDL: 157.34
Total CHOL/HDL Ratio: 3
Triglycerides: 74 mg/dL (ref 0.0–149.0)
VLDL: 14.8 mg/dL (ref 0.0–40.0)

## 2021-08-30 LAB — COMPREHENSIVE METABOLIC PANEL
ALT: 15 U/L (ref 0–35)
AST: 20 U/L (ref 0–37)
Albumin: 4.4 g/dL (ref 3.5–5.2)
Alkaline Phosphatase: 63 U/L (ref 39–117)
BUN: 12 mg/dL (ref 6–23)
CO2: 27 mEq/L (ref 19–32)
Calcium: 9.7 mg/dL (ref 8.4–10.5)
Chloride: 102 mEq/L (ref 96–112)
Creatinine, Ser: 0.65 mg/dL (ref 0.40–1.20)
GFR: 86.54 mL/min (ref >60.00)
Glucose, Bld: 87 mg/dL (ref 70–99)
Potassium: 3.9 mEq/L (ref 3.5–5.1)
Sodium: 138 mEq/L (ref 135–145)
Total Bilirubin: 0.6 mg/dL (ref 0.2–1.2)
Total Protein: 7.4 g/dL (ref 6.0–8.3)

## 2021-08-30 LAB — CBC WITH DIFFERENTIAL/PLATELET
Basophils Absolute: 0 10*3/uL (ref 0.0–0.1)
Basophils Relative: 0.7 % (ref 0.0–3.0)
Eosinophils Absolute: 0.1 10*3/uL (ref 0.0–0.7)
Eosinophils Relative: 1.5 % (ref 0.0–5.0)
HCT: 38.9 % (ref 36.0–46.0)
Hemoglobin: 13 g/dL (ref 12.0–15.0)
Lymphocytes Relative: 27.6 % (ref 12.0–46.0)
Lymphs Abs: 1.5 10*3/uL (ref 0.7–4.0)
MCHC: 33.4 g/dL (ref 30.0–36.0)
MCV: 84.9 fl (ref 78.0–100.0)
Monocytes Absolute: 0.5 10*3/uL (ref 0.1–1.0)
Monocytes Relative: 9.6 % (ref 3.0–12.0)
Neutro Abs: 3.3 10*3/uL (ref 1.4–7.7)
Neutrophils Relative %: 60.6 % (ref 43.0–77.0)
Platelets: 238 10*3/uL (ref 150.0–400.0)
RBC: 4.58 Mil/uL (ref 3.87–5.11)
RDW: 14.3 % (ref 11.5–15.5)
WBC: 5.4 10*3/uL (ref 4.0–10.5)

## 2021-08-30 LAB — VITAMIN D 25 HYDROXY (VIT D DEFICIENCY, FRACTURES): VITD: 66.9 ng/mL (ref 30.00–100.00)

## 2021-08-30 NOTE — Patient Instructions (Addendum)
Health Maintenance Due  Topic Date Due   TETANUS/TDAP -  Please send [place] the date online of your tdap shot.  Never done   Please stop by lab before you go If you have mychart- we will send your results within 3 business days of [place] receiving them.  If you do not have mychart- we will call you about results within 5 business days of [place] receiving them.  *please also note that you will see labs on mychart as soon as they post. I will later go in and write notes on them- will say "notes from Dr. Yong Channel"  So glad you're maintaining in good health with exercising! Please keep up the great work!   Recommended follow up: Return in about 1 year (around 08/30/2022) for follow-up or sooner if needed.  Happy Holidays!

## 2021-09-23 ENCOUNTER — Ambulatory Visit (INDEPENDENT_AMBULATORY_CARE_PROVIDER_SITE_OTHER): Payer: Medicare Other

## 2021-09-23 ENCOUNTER — Other Ambulatory Visit: Payer: Self-pay

## 2021-09-23 DIAGNOSIS — Z0000 Encounter for general adult medical examination without abnormal findings: Secondary | ICD-10-CM

## 2021-09-23 NOTE — Progress Notes (Addendum)
Virtual Visit via Telephone Note  I connected with  Taylor Bailey on 09/23/21 at  2:30 PM EST by telephone and verified that I am speaking with the correct person using two identifiers.  Medicare Annual Wellness visit completed telephonically due to Covid-19 pandemic.   Persons participating in this call: This Health Coach and this patient.   Location: Patient: home Provider: office    I discussed the limitations, risks, security and privacy concerns of performing an evaluation and management service by telephone and the availability of in person appointments. The patient expressed understanding and agreed to proceed.  Unable to perform video visit due to video visit attempted and failed and/or patient does not have video capability.   Some vital signs may be absent or patient reported.   Willette Brace, LPN   Subjective:   Taylor Bailey is a 76 y.o. female who presents for an Initial Medicare Annual Wellness Visit.  Review of Systems     Cardiac Risk Factors include: advanced age (>72men, >3 women);dyslipidemia     Objective:    There were no vitals filed for this visit. There is no height or weight on file to calculate BMI.  Advanced Directives 09/23/2021 09/29/2020 09/08/2020 08/28/2020  Does Patient Have a Medical Advance Directive? Yes No Yes Yes  Type of [employer] [place];Living will [place];Living will  Does patient want to make changes to medical advance directive? - No - Patient declined - -  Copy of [place] in Chart? Yes - validated most recent copy scanned in chart (See row information) - No - copy requested -  Would patient like information on creating a medical advance directive? - No - Patient declined - -    Current Medications (verified) Outpatient Encounter Medications as of 09/23/2021  Medication Sig   Cholecalciferol (VITAMIN D) 50 MCG  (2000 UT) CAPS Take 2,000 Units by mouth daily. Gummie   fluticasone (FLONASE) 50 MCG/ACT nasal spray Place 2 sprays into both nostrils daily as needed for rhinitis.   No facility-administered encounter medications on file as of 09/23/2021.    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/2021   of nose   BREAST BIOPSY Left    BENIGN   BREAST EXCISIONAL BIOPSY Left    BRONCHIAL NEEDLE ASPIRATION BIOPSY  08/28/2020   Procedure: BRONCHIAL NEEDLE ASPIRATION BIOPSIES;  Surgeon: Garner Nash, DO;  Location: [facility];  Service: Pulmonary;;   CATARACT EXTRACTION, BILATERAL  2018   COLONOSCOPY     INTERCOSTAL NERVE BLOCK Left 10/01/2020   Procedure: INTERCOSTAL NERVE BLOCK;  Surgeon: Melrose Nakayama, MD;  Location: [facility];  Service: Thoracic;  Laterality: Left;   IR THORACENTESIS ASP PLEURAL SPACE W/IMG GUIDE  10/30/2020   NODE DISSECTION Left 10/01/2020   Procedure: NODE DISSECTION;  Surgeon: Melrose Nakayama, MD;  Location: [facility];  Service: Thoracic;  Laterality: Left;  TUBAL LIGATION     VIDEO BRONCHOSCOPY WITH ENDOBRONCHIAL ULTRASOUND Bilateral 08/28/2020   Procedure: VIDEO BRONCHOSCOPY WITH ENDOBRONCHIAL ULTRASOUND;  Surgeon: Garner Nash, DO;  Location: [facility];  Service: Pulmonary;  Laterality: Bilateral;   WISDOM TOOTH EXTRACTION     Family History  Problem Relation Age of Onset   Breast cancer Mother        lived to 12. relatives have lived in 66s and 68s   Diabetes Maternal Grandmother    Other  Father        black lung- lived to 2   Healthy Sister    Colon cancer Neg Hx    Pancreatic cancer Neg Hx    Stomach cancer Neg Hx    Social History   Socioeconomic History   Marital status: Widowed    Spouse name: Not on file   Number of children: Not on file   Years of education: Not on file   Highest education level: Not on file  Occupational History   Not on file  Tobacco Use   Smoking status: Never   Smokeless tobacco: Never  Vaping Use   Vaping Use: Never used  Substance and Sexual Activity   Alcohol use: Yes    Alcohol/week: 1.0 standard drink    Types: 1 Glasses of wine per week    Comment: occ glass of wine   Drug use: No   Sexual activity: Not Currently    Comment: 1st intercourse- 22, partners- 1   Other Topics Concern   Not on file  Social History Narrative   Widowed in 2013- multiple medical conditions. 2 children- son married with 2 kids (boy 66, daughter 32) and daughter in commercial real estate (age 76) 74.       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      Hobbies: gardening, multiple projects at home, enjoys readings, card games   Social Determinants of Radio broadcast assistant Strain: Low Risk    Difficulty of Paying Living Expenses: Not hard at [employer] Insecurity: No Food Insecurity   Worried About Charity fundraiser in the Last Year: Never true   Arboriculturist in the Last Year: Never true  Transportation Needs: No Transportation Needs   Film/video editor (Medical): No   Lack of Transportation (Non-Medical): No  Physical Activity: Sufficiently Active   Days of Exercise per Week: 5 days   Minutes of Exercise per Session: 50 min  Stress: No Stress Concern Present   Feeling of Stress : Not at all  Social Connections: Moderately Integrated   Frequency of  Communication with Friends and Family: More than three times a week   Frequency of Social Gatherings with Friends and Family: More than three times a week   Attends Religious Services: 1 to 4 times per year   Active Member of [employer] or Organizations: Yes   Attends Archivist Meetings: 1 to 4 times per year   Marital Status: Widowed    Tobacco Counseling Counseling given: Not Answered   Clinical Intake:  Pre-visit preparation completed: Yes  Pain : No/denies pain     BMI - recorded: 26.29 Nutritional Status: BMI 25 -29 Overweight Nutritional Risks: None Diabetes: No  How often do you need to have someone help you when  you read instructions, pamphlets, or other written materials from your doctor or pharmacy?: 1 - Never  Diabetic?no  Interpreter Needed?: No  Information entered by :: Charlott Rakes, LPN   Activities of Daily Living In your present state of health, do you have any difficulty performing the following activities: 09/23/2021 10/05/2020  Hearing? N -  Vision? N -  Difficulty concentrating or making decisions? N -  Walking or climbing stairs? N -  Dressing or bathing? N -  Doing errands, shopping? N N  Preparing Food and eating ? N -  Using the Toilet? N -  In the past six months, have you accidently leaked urine? N -  Do you have problems with loss of bowel control? N -  Managing your Medications? N -  Managing your Finances? N -  Housekeeping or managing your Housekeeping? N -  Some recent data might be hidden    Patient Care Team: [name], MD as PCP - General (Family Medicine) Valrie Hart, RN as Oncology Nurse Navigator (Oncology)  Indicate any recent Medical Services you may have received from other than [facility] providers in the past year (date may be approximate).     Assessment:   This is a routine wellness examination for Taylor Bailey.  Hearing/Vision screen Hearing Screening - Comments:: Pt denies any hearing issues  Vision  Screening - Comments:: Pt follows up with [facility] for annual eye exams   Dietary issues and exercise activities discussed: Current Exercise Habits: Home exercise routine, Type of exercise: walking, Time (Minutes): 50, Frequency (Times/Week): 5, Weekly Exercise (Minutes/Week): 250   Goals Addressed             This Visit's Progress    Patient Stated       Keep living        Depression Screen PHQ 2/9 Scores 09/23/2021 08/30/2021 04/20/2021 04/02/2019  PHQ - 2 Score 0 0 0 0  PHQ- 9 Score - - - 0    Fall Risk Fall Risk  09/23/2021 08/30/2021 04/20/2021  Falls in the past year? 0 0 0  Number falls in past yr: 0 0 0  Injury with Fall? 0 0 0  Risk for fall due to : Impaired vision No Fall Risks No Fall Risks  Follow up Falls prevention discussed Falls evaluation completed Falls evaluation completed    [place]:   Any stairs in or around the home? Yes  If so, are there any without handrails? No  Home free of loose throw rugs in walkways, pet beds, electrical cords, etc? Yes  Adequate lighting in your home to reduce risk of falls? Yes   ASSISTIVE DEVICES UTILIZED TO PREVENT FALLS:  Life alert? Yes  Use of a cane, walker or w/c? No  Grab bars in the bathroom? Yes  Shower chair or bench in shower? Yes  Elevated toilet seat or a handicapped toilet? No   TIMED UP AND GO:  Was the test performed? No .   Cognitive Function:     6CIT Screen 09/23/2021  What Year? 0 points  What month? 0 points  What time? 0 points  Count back from 20 0 points  Months in reverse 0 points  Repeat phrase 0 points  Total Score 0    Immunizations Immunization History  Administered Date(s) Administered   Moderna Sars-Covid-2 Vaccination 11/13/2019, 12/11/2019, 07/16/2020    TDAP status: Due, Education has been provided regarding the importance of this vaccine. Advised may  receive this vaccine at local pharmacy or Health Dept. Aware to provide a  copy of the vaccination record if obtained from local pharmacy or Health Dept. Verbalized acceptance and understanding.  Flu Vaccine status: Declined, Education has been provided regarding the importance of this vaccine but patient still declined. Advised may receive this vaccine at local pharmacy or Health Dept. Aware to provide a copy of the vaccination record if obtained from local pharmacy or Health Dept. Verbalized acceptance and understanding.  Pneumococcal vaccine status: Declined,  Education has been provided regarding the importance of this vaccine but patient still declined. Advised may receive this vaccine at local pharmacy or Health Dept. Aware to provide a copy of the vaccination record if obtained from local pharmacy or Health Dept. Verbalized acceptance and understanding.   Covid-19 vaccine status: Completed vaccines  Qualifies for Shingles Vaccine? Yes   Zostavax completed No   Shingrix Completed?: No.    Education has been provided regarding the importance of this vaccine. Patient has been advised to call insurance company to determine out of pocket expense if they have not yet received this vaccine. Advised may also receive vaccine at local pharmacy or Health Dept. Verbalized acceptance and understanding.  Screening Tests Health Maintenance  Topic Date Due   TETANUS/TDAP  Never done   COVID-19 Vaccine (4 - Booster for Moderna series) 09/10/2020   Zoster Vaccines- Shingrix (1 of 2) 11/28/2021 (Originally 12/01/1964)   Pneumonia Vaccine 69+ Years old (1 - PCV) 08/30/2022 (Originally 12/01/1951)   MAMMOGRAM  01/12/2023   COLONOSCOPY (Pts 45-32yrs Insurance coverage will need to be confirmed)  11/23/2023   DEXA SCAN  Completed   Hepatitis C Screening  Completed   HPV VACCINES  Aged Out   INFLUENZA VACCINE  Discontinued    Health Maintenance  Health Maintenance Due  Topic Date Due   TETANUS/TDAP  Never done   COVID-19 Vaccine (4 - Booster for Moderna series) 09/10/2020     Colorectal cancer screening: Type of screening: Colonoscopy. Completed 11/22/13. Repeat every 10 years  Mammogram status: Completed 01/11/21. Repeat every year  Bone Density status: Completed 10/31/17. Results reflect: Bone density results: OSTEOPOROSIS. Repeat every 2 years.   Additional Screening:  Hepatitis C Screening:  Completed 01/03/18  Vision Screening: Recommended annual ophthalmology exams for early detection of glaucoma and other disorders of the eye. Is the patient up to date with their annual eye exam?  Yes  Who is the provider or what is the name of the office in which the patient attends annual eye exams? [facility]  If pt is not established with a provider, would they like to be referred to a provider to establish care? No .   Dental Screening: Recommended annual dental exams for proper oral hygiene  Community Resource Referral / Chronic Care Management: CRR required this visit?  No   CCM required this visit?  No      Plan:     I have personally reviewed and noted the following in the patients chart:   Medical and social history Use of alcohol, tobacco or illicit drugs  Current medications and supplements including opioid prescriptions. Patient is not currently taking opioid prescriptions. Functional ability and status Nutritional status Physical activity Advanced directives List of other physicians Hospitalizations, surgeries, and ER visits in previous 12 months Vitals Screenings to include cognitive, depression, and falls Referrals and appointments  In addition, I have reviewed and discussed with patient certain preventive protocols, quality metrics, and best practice recommendations. A  written personalized care plan for preventive services as well as general preventive health recommendations were provided to patient.     Willette Brace, LPN   [id number]/32/[id number]   Nurse Notes: none

## 2021-09-23 NOTE — Patient Instructions (Signed)
Ms. Taylor Bailey , Thank you for taking time to come for your Medicare Wellness Visit. I appreciate your ongoing commitment to your health goals. Please review the following plan we discussed and let me know if I can assist you in the future.   Screening recommendations/referrals: Colonoscopy: Done 11/22/13 repeat every 10 years  Mammogram: Done 01/11/21 repeat every year  Bone Density: Done 10/24/17 repeat every 2 years  Recommended yearly ophthalmology/optometry visit for glaucoma screening and checkup Recommended yearly dental visit for hygiene and checkup  Vaccinations: Influenza vaccine: N/A Pneumococcal vaccine: N/A Tdap vaccine: Due and discussed  Shingles vaccine: Shingrix Please contact your pharmacy for coverage information.    Covid-19:Completed 3/3, 3/31, 07/16/20   Advanced directives: copies in chart   Conditions/risks identified: stay healthy   Next appointment: Follow up in one year for your annual wellness visit    Preventive Care 65 Years and Older, Female Preventive care refers to lifestyle choices and visits with your health care provider that can promote health and wellness. What does preventive care include? A yearly physical exam. This is also called an annual well check. Dental exams once or twice a year. Routine eye exams. Ask your health care provider how often you should have your eyes checked. Personal lifestyle choices, including: Daily care of your teeth and gums. Regular physical activity. Eating a healthy diet. Avoiding tobacco and drug use. Limiting alcohol use. Practicing safe sex. Taking low-dose aspirin every day. Taking vitamin and mineral supplements as recommended by your health care provider. What happens during an annual well check? The services and screenings done by your health care provider during your annual well check will depend on your age, overall health, lifestyle risk factors, and family history of disease. Counseling  Your health  care provider may ask you questions about your: Alcohol use. Tobacco use. Drug use. Emotional well-being. Home and relationship well-being. Sexual activity. Eating habits. History of falls. Memory and ability to understand (cognition). Work and work Statistician. Reproductive health. Screening  You may have the following tests or measurements: Height, weight, and BMI. Blood pressure. Lipid and cholesterol levels. These may be checked every 5 years, or more frequently if you are over 85 years old. Skin check. Lung cancer screening. You may have this screening every year starting at age 33 if you have a 30-pack-year history of smoking and currently smoke or have quit within the past 15 years. Fecal occult blood test (FOBT) of the stool. You may have this test every year starting at age 15. Flexible sigmoidoscopy or colonoscopy. You may have a sigmoidoscopy every 5 years or a colonoscopy every 10 years starting at age 66. Hepatitis C blood test. Hepatitis B blood test. Sexually transmitted disease (STD) testing. Diabetes screening. This is done by checking your blood sugar (glucose) after you have not eaten for a while (fasting). You may have this done every 1-3 years. Bone density scan. This is done to screen for osteoporosis. You may have this done starting at age 92. Mammogram. This may be done every 1-2 years. Talk to your health care provider about how often you should have regular mammograms. Talk with your health care provider about your test results, treatment options, and if necessary, the need for more tests. Vaccines  Your health care provider may recommend certain vaccines, such as: Influenza vaccine. This is recommended every year. Tetanus, diphtheria, and acellular pertussis (Tdap, Td) vaccine. You may need a Td booster every 10 years. Zoster vaccine. You may need this after  age 45. Pneumococcal 13-valent conjugate (PCV13) vaccine. One dose is recommended after age  69. Pneumococcal polysaccharide (PPSV23) vaccine. One dose is recommended after age 47. Talk to your health care provider about which screenings and vaccines you need and how often you need them. This information is not intended to replace advice given to you by your health care provider. Make sure you discuss any questions you have with your health care provider. Document Released: 09/25/2015 Document Revised: 05/18/2016 Document Reviewed: 06/30/2015 Elsevier Interactive Patient Education  2017 [place] Prevention in the Home Falls can cause injuries. They can happen to people of all ages. There are many things you can do to make your home safe and to help prevent falls. What can I do on the outside of my home? Regularly fix the edges of walkways and driveways and fix any cracks. Remove anything that might make you trip as you walk through a door, such as a raised step or threshold. Trim any bushes or trees on the path to your home. Use bright outdoor lighting. Clear any walking paths of anything that might make someone trip, such as rocks or tools. Regularly check to see if handrails are loose or broken. Make sure that both sides of any steps have handrails. Any raised decks and porches should have guardrails on the edges. Have any leaves, snow, or ice cleared regularly. Use sand or salt on walking paths during winter. Clean up any spills in your garage right away. This includes oil or grease spills. What can I do in the bathroom? Use night lights. Install grab bars by the toilet and in the tub and shower. Do not use towel bars as grab bars. Use non-skid mats or decals in the tub or shower. If you need to sit down in the shower, use a plastic, non-slip stool. Keep the floor dry. Clean up any water that spills on the floor as soon as it happens. Remove soap buildup in the tub or shower regularly. Attach bath mats securely with double-sided non-slip rug tape. Do not have throw  rugs and other things on the floor that can make you trip. What can I do in the bedroom? Use night lights. Make sure that you have a light by your bed that is easy to reach. Do not use any sheets or blankets that are too big for your bed. They should not hang down onto the floor. Have a firm chair that has side arms. You can use this for support while you get dressed. Do not have throw rugs and other things on the floor that can make you trip. What can I do in the kitchen? Clean up any spills right away. Avoid walking on wet floors. Keep items that you use a lot in easy-to-reach places. If you need to reach something above you, use a strong step stool that has a grab bar. Keep electrical cords out of the way. Do not use floor polish or wax that makes floors slippery. If you must use wax, use non-skid floor wax. Do not have throw rugs and other things on the floor that can make you trip. What can I do with my stairs? Do not leave any items on the stairs. Make sure that there are handrails on both sides of the stairs and use them. Fix handrails that are broken or loose. Make sure that handrails are as long as the stairways. Check any carpeting to make sure that it is firmly attached to the stairs.  Fix any carpet that is loose or worn. Avoid having throw rugs at the top or bottom of the stairs. If you do have throw rugs, attach them to the floor with carpet tape. Make sure that you have a light switch at the top of the stairs and the bottom of the stairs. If you do not have them, ask someone to add them for you. What else can I do to help prevent falls? Wear shoes that: Do not have high heels. Have rubber bottoms. Are comfortable and fit you well. Are closed at the toe. Do not wear sandals. If you use a stepladder: Make sure that it is fully opened. Do not climb a closed stepladder. Make sure that both sides of the stepladder are locked into place. Ask someone to hold it for you, if  possible. Clearly mark and make sure that you can see: Any grab bars or handrails. First and last steps. Where the edge of each step is. Use tools that help you move around (mobility aids) if they are needed. These include: Canes. Walkers. Scooters. Crutches. Turn on the lights when you go into a dark area. Replace any light bulbs as soon as they burn out. Set up your furniture so you have a clear path. Avoid moving your furniture around. If any of your floors are uneven, fix them. If there are any pets around you, be aware of where they are. Review your medicines with your doctor. Some medicines can make you feel dizzy. This can increase your chance of falling. Ask your doctor what other things that you can do to help prevent falls. This information is not intended to replace advice given to you by your health care provider. Make sure you discuss any questions you have with your health care provider. Document Released: 06/25/2009 Document Revised: 02/04/2016 Document Reviewed: 10/03/2014 Elsevier Interactive Patient Education  2017 [employer].

## 2021-11-08 ENCOUNTER — Inpatient Hospital Stay: Payer: Medicare Other | Attending: Internal Medicine

## 2021-11-08 ENCOUNTER — Other Ambulatory Visit: Payer: Self-pay

## 2021-11-08 ENCOUNTER — Ambulatory Visit (HOSPITAL_COMMUNITY)
Admission: RE | Admit: 2021-11-08 | Discharge: 2021-11-08 | Disposition: A | Discharge status: Home / Self Care | Payer: Medicare Other | Source: Ambulatory Visit | Attending: Internal Medicine | Admitting: Internal Medicine

## 2021-11-08 DIAGNOSIS — C3490 Malignant neoplasm of unspecified part of unspecified bronchus or lung: Secondary | ICD-10-CM

## 2021-11-08 DIAGNOSIS — C3412 Malignant neoplasm of upper lobe, left bronchus or lung: Secondary | ICD-10-CM | POA: Insufficient documentation

## 2021-11-08 LAB — CBC WITH DIFFERENTIAL (CANCER CENTER ONLY)
Abs Immature Granulocytes: 0.01 10*3/uL (ref 0.00–0.07)
Basophils Absolute: 0 10*3/uL (ref 0.0–0.1)
Basophils Relative: 1 %
Eosinophils Absolute: 0.1 10*3/uL (ref 0.0–0.5)
Eosinophils Relative: 2 %
HCT: 37.6 % (ref 36.0–46.0)
Hemoglobin: 12.6 g/dL (ref 12.0–15.0)
Immature Granulocytes: 0 %
Lymphocytes Relative: 33 %
Lymphs Abs: 1.3 10*3/uL (ref 0.7–4.0)
MCH: 28.6 pg (ref 26.0–34.0)
MCHC: 33.5 g/dL (ref 30.0–36.0)
MCV: 85.5 fL (ref 80.0–100.0)
Monocytes Absolute: 0.5 10*3/uL (ref 0.1–1.0)
Monocytes Relative: 11 %
Neutro Abs: 2.2 10*3/uL (ref 1.7–7.7)
Neutrophils Relative %: 53 %
Platelet Count: 237 10*3/uL (ref 150–400)
RBC: 4.4 MIL/uL (ref 3.87–5.11)
RDW: 13.6 % (ref 11.5–15.5)
WBC Count: 4.1 10*3/uL (ref 4.0–10.5)
nRBC: 0 % (ref 0.0–0.2)

## 2021-11-08 LAB — CMP (CANCER CENTER ONLY)
ALT: 12 U/L (ref 0–44)
AST: 17 U/L (ref 15–41)
Albumin: 4.2 g/dL (ref 3.5–5.0)
Alkaline Phosphatase: 81 U/L (ref 38–126)
Anion gap: 5 (ref 5–15)
BUN: 13 mg/dL (ref 8–23)
CO2: 30 mmol/L (ref 22–32)
Calcium: 9.7 mg/dL (ref 8.9–10.3)
Chloride: 106 mmol/L (ref 98–111)
Creatinine: 0.67 mg/dL (ref 0.44–1.00)
GFR, Estimated: >60 mL/min (ref >60)
Glucose, Bld: 89 mg/dL (ref 70–99)
Potassium: 4.4 mmol/L (ref 3.5–5.1)
Sodium: 141 mmol/L (ref 135–145)
Total Bilirubin: 0.4 mg/dL (ref 0.3–1.2)
Total Protein: 7.1 g/dL (ref 6.5–8.1)

## 2021-11-08 MED ORDER — SODIUM CHLORIDE (PF) 0.9 % IJ SOLN
INTRAMUSCULAR | Status: AC
  Filled 2021-11-08: qty 50

## 2021-11-08 MED ORDER — IOHEXOL 300 MG/ML  SOLN
75.0000 mL | Freq: Once | INTRAMUSCULAR | Status: AC | PRN
  Administered 2021-11-08: 75 mL via INTRAVENOUS

## 2021-11-10 ENCOUNTER — Encounter: Payer: Self-pay | Admitting: Internal Medicine

## 2021-11-10 ENCOUNTER — Other Ambulatory Visit: Payer: Self-pay

## 2021-11-10 ENCOUNTER — Inpatient Hospital Stay: Payer: Medicare Other | Attending: Internal Medicine | Admitting: Internal Medicine

## 2021-11-10 VITALS — BP 152/86 | HR 86 | Temp 97.7°F | Resp 20 | Ht 65.0 in | Wt 160.9 lb

## 2021-11-10 DIAGNOSIS — Z85118 Personal history of other malignant neoplasm of bronchus and lung: Secondary | ICD-10-CM | POA: Diagnosis not present

## 2021-11-10 DIAGNOSIS — Z85828 Personal history of other malignant neoplasm of skin: Secondary | ICD-10-CM | POA: Insufficient documentation

## 2021-11-10 DIAGNOSIS — C3490 Malignant neoplasm of unspecified part of unspecified bronchus or lung: Secondary | ICD-10-CM | POA: Diagnosis not present

## 2021-11-10 DIAGNOSIS — E559 Vitamin D deficiency, unspecified: Secondary | ICD-10-CM | POA: Insufficient documentation

## 2021-11-10 DIAGNOSIS — R911 Solitary pulmonary nodule: Secondary | ICD-10-CM | POA: Insufficient documentation

## 2021-11-10 DIAGNOSIS — M810 Age-related osteoporosis without current pathological fracture: Secondary | ICD-10-CM | POA: Diagnosis not present

## 2021-11-10 DIAGNOSIS — K449 Diaphragmatic hernia without obstruction or gangrene: Secondary | ICD-10-CM | POA: Diagnosis not present

## 2021-11-10 DIAGNOSIS — D3A090 Benign carcinoid tumor of the bronchus and lung: Secondary | ICD-10-CM

## 2021-11-10 NOTE — Progress Notes (Signed)
?    [facility] ?[phone]   [phone] ? ?OFFICE PROGRESS NOTE ? ?[name], MD ?[place] [address] ? ?DIAGNOSIS: Stage IIA (T1 a, N1, M0) low-grade neuroendocrine carcinoma, carcinoid tumor diagnosed in January 2022 ? ?PRIOR THERAPY: Status post left upper lobectomy with lymph node dissection under the care of Dr. Roxan Hockey on October 01, 2020. ? ?CURRENT THERAPY: Observation ? ?INTERVAL HISTORY: ?Taylor Bailey 76 y.o. female returns to the clinic today for follow-up visit.  The patient is feeling fine today with no concerning complaints.  She denied having any chest pain, shortness of breath, cough or hemoptysis.  She denied having any fever or chills.  She has no nausea, vomiting, diarrhea or constipation.  She has no headache or visual changes.  She has some soreness in her arms after working in the yard for long time yesterday.  She is here today for evaluation with repeat CT scan of the chest for restaging of her disease. ? ?MEDICAL HISTORY: ?Past Medical History:  ?Diagnosis Date  ? Anxiety   ? mediatation- last time as a teenager  ? Cancer [facility]) 2020  ? 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 Chicken pox   ? Complication of anesthesia 2015  ? Colonoscopy  ? Osteoporosis 10/2017  ? T score -2.9  ? Vitamin D deficiency   ? takes 2000 units each day  ? ? ?ALLERGIES:  is allergic to fluoride preparations, influenza vaccines, and prednisone. ? ?MEDICATIONS:  ?Current Outpatient Medications  ?Medication Sig Dispense Refill  ? Cholecalciferol (VITAMIN D) 50 MCG (2000 UT) CAPS Take 2,000 Units by mouth daily. Gummie    ? fluticasone (FLONASE) 50 MCG/ACT nasal spray Place 2 sprays into both nostrils daily as needed for rhinitis.    ? ?No current facility-administered medications for this visit.  ? ? ?SURGICAL HISTORY:  ?Past Surgical History:  ?Procedure Laterality Date  ? 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 of nose   ? BREAST BIOPSY Left   ? BENIGN  ? BREAST EXCISIONAL BIOPSY Left   ? BRONCHIAL NEEDLE ASPIRATION BIOPSY  08/28/2020  ? Procedure: BRONCHIAL NEEDLE ASPIRATION BIOPSIES;  Surgeon: Garner Nash, DO;  Location: [place];  Service: Pulmonary;;  ? CATARACT EXTRACTION, BILATERAL  2018  ? COLONOSCOPY    ? INTERCOSTAL NERVE BLOCK Left 10/01/2020  ? Procedure: INTERCOSTAL NERVE BLOCK;  Surgeon: Melrose Nakayama, MD;  Location: [place];  Service: Thoracic;  Laterality: Left;  ? IR THORACENTESIS ASP PLEURAL SPACE W/IMG GUIDE  10/30/2020  ? NODE DISSECTION Left 10/01/2020  ? Procedure: NODE DISSECTION;  Surgeon: Melrose Nakayama, MD;  Location: [place];  Service: Thoracic;  Laterality: Left;  ? TUBAL LIGATION    ? VIDEO BRONCHOSCOPY WITH ENDOBRONCHIAL ULTRASOUND Bilateral 08/28/2020  ? Procedure: VIDEO BRONCHOSCOPY WITH ENDOBRONCHIAL ULTRASOUND;  Surgeon: Garner Nash, DO;  Location: [place];  Service: Pulmonary;  Laterality: Bilateral;  ? WISDOM TOOTH EXTRACTION    ? ? ?REVIEW OF SYSTEMS:  A comprehensive review of systems was negative.  ? ?PHYSICAL EXAMINATION: General appearance: alert, cooperative, and no distress ?Head: Normocephalic, without obvious abnormality, atraumatic ?Neck: no adenopathy, no JVD, supple, symmetrical, trachea midline, and thyroid not enlarged, symmetric, no tenderness/mass/nodules ?Lymph nodes: Cervical, supraclavicular, and axillary nodes normal. ?Resp: clear to auscultation bilaterally ?Back: symmetric, no curvature. ROM normal. No CVA tenderness. ?Cardio: regular rate and rhythm, S1, S2 normal, no murmur, click, rub or gallop ?  GI: soft, non-tender; bowel sounds normal; no masses,  no organomegaly ?Extremities: extremities normal, atraumatic, no cyanosis or edema ? ?ECOG PERFORMANCE STATUS: 0 - Asymptomatic ? ?Blood pressure (!) 152/86, pulse 86, temperature 97.7 ?F (36.5 ?C), temperature source Tympanic, resp. rate 20, height 5\' 5"  (1.651 m), weight 160 lb 14.4 oz (73 kg),  SpO2 99 %. ? ?LABORATORY DATA: ?Lab Results  ?Component Value Date  ? WBC 4.1 11/08/2021  ? HGB 12.6 11/08/2021  ? HCT 37.6 11/08/2021  ? MCV 85.5 11/08/2021  ? PLT 237 11/08/2021  ? ? ?  Chemistry   ?   ?Component Value Date/Time  ? NA 141 11/08/2021 1048  ? K 4.4 11/08/2021 1048  ? CL 106 11/08/2021 1048  ? CO2 30 11/08/2021 1048  ? BUN 13 11/08/2021 1048  ? CREATININE 0.67 11/08/2021 1048  ?    ?Component Value Date/Time  ? CALCIUM 9.7 11/08/2021 1048  ? ALKPHOS 81 11/08/2021 1048  ? AST 17 11/08/2021 1048  ? ALT 12 11/08/2021 1048  ? BILITOT 0.4 11/08/2021 1048  ?  ? ? ? ?RADIOGRAPHIC STUDIES: ?CT Chest W Contrast ? ?Result Date: 11/09/2021 ?CLINICAL DATA:  Low-grade neuroendocrine tumor (stage IIA). LEFT lower lobe lobectomy. Lymph node dissection. * onc * EXAM: CT CHEST WITH CONTRAST TECHNIQUE: Multidetector CT imaging of the chest was performed during intravenous contrast administration. RADIATION DOSE REDUCTION: This exam was performed according to the departmental dose-optimization program which includes automated exposure control, adjustment of the mA and/or kV according to patient size and/or use of iterative reconstruction technique. CONTRAST:  22mL OMNIPAQUE IOHEXOL 300 MG/ML  SOLN COMPARISON:  CT 05/11/2021 FINDINGS: Cardiovascular: No significant vascular findings. Normal heart size. No pericardial effusion. Mediastinum/Nodes: No axillary or supraclavicular adenopathy. No mediastinal or hilar adenopathy. No pericardial fluid. Esophagus normal. Lungs/Pleura: Post LEFT upper lobectomy. No nodularity within the LEFT lung. RIGHT lung is mildly hyperexpanded. Ground-glass nodule in the RIGHT lower lobe measuring 4 mm (image 111/5) is unchanged. Upper Abdomen: Limited view of the liver, kidneys, pancreas are unremarkable. Normal adrenal glands. Moderate size hiatal hernia. Musculoskeletal: Degenerative osteophytosis of the spine. IMPRESSION: 1. Post LEFT upper lobectomy. No evidence of carcinoid tumor  recurrence. 2. Stable small ground-glass nodule in the RIGHT lower lobe. 3. Moderate size hiatal hernia. Electronically Signed   By: Suzy Bouchard M.D.   On: 11/09/2021 16:35   ? ? ?ASSESSMENT AND PLAN: This is a very pleasant 76 years old white female recently diagnosed with a stage IIA (T1 a, N1, M0) low-grade neuroendocrine carcinoma, carcinoid tumor diagnosed in January 2022 status post left lower lobectomy with lymph node dissection under the care of Dr. Roxan Hockey on October 01, 2020. ?The patient has been on observation since that time and she is feeling fine today with no concerning complaints. ?She had repeat CT scan of the chest performed recently.  I personally and independently reviewed the scans and discussed the results with the patient today. ?Her scan showed no concerning findings for disease recurrence or metastasis. ?I recommended for her to continue on observation with repeat CT scan of the chest in 1 year. ?The patient voices understanding of current disease status and treatment options and is in agreement with the current care plan. ? ?All questions were answered. The patient knows to call the clinic with any problems, questions or concerns. We can certainly see the patient much sooner if necessary. ? ?Disclaimer: This note was dictated with voice recognition software. Similar sounding words can inadvertently be transcribed and  may not be corrected upon review. ? ? ?  ?  ?

## 2021-11-30 ENCOUNTER — Other Ambulatory Visit: Payer: Self-pay | Admitting: Family Medicine

## 2021-11-30 ENCOUNTER — Other Ambulatory Visit: Payer: Self-pay | Admitting: Obstetrics & Gynecology

## 2021-11-30 DIAGNOSIS — Z1231 Encounter for screening mammogram for malignant neoplasm of breast: Secondary | ICD-10-CM

## 2022-01-05 ENCOUNTER — Ambulatory Visit (INDEPENDENT_AMBULATORY_CARE_PROVIDER_SITE_OTHER): Payer: Medicare Other | Admitting: Family

## 2022-01-05 ENCOUNTER — Encounter: Payer: Self-pay | Admitting: Family

## 2022-01-05 VITALS — BP 142/81 | HR 69 | Temp 97.6°F | Ht 66.0 in | Wt 158.1 lb

## 2022-01-05 DIAGNOSIS — J309 Allergic rhinitis, unspecified: Secondary | ICD-10-CM | POA: Diagnosis not present

## 2022-01-05 DIAGNOSIS — R0982 Postnasal drip: Secondary | ICD-10-CM | POA: Diagnosis not present

## 2022-01-05 NOTE — Patient Instructions (Addendum)
It was very nice to see you today! ? ?Start using the Flonase inhaler or Nasacort over the counter. ?Follow the instructions below: ?Start Flonase/Nasacort nasal spray 1 spray each nostril twice a day, 2 sprays twice a day is ok for bad symptoms for 1-2 weeks, then reduce to 1 spray twice a day or daily. ?Use Nasal saline spray (i.e., Simply Saline) or nasal saline lavage (i.e., NeilMed, Neti Pot) is recommended as needed and prior to medicated nasal sprays. ?May use over the counter antihistamines such as Xyzal (levocetirizine), Zyrtec (cetirizine), Claritin (loratadine), or Allegra (fexofenadine) daily as needed. May take twice a day if needed as long as it does not cause drowsiness.  ? ?Continue using the Azelastine nasal spray. ?Drink at least 2 liters of water daily. ? ? ?PLEASE NOTE: ? ?If you had any lab tests please let us know if you have not heard back within a few days. You may see your results on MyChart before we have a chance to review them but we will give you a call once they are reviewed by [place]. If we ordered any referrals today, please let us know if you have not heard from their office within the next week.  ? ?

## 2022-01-05 NOTE — Progress Notes (Signed)
? ?Subjective:  ? ? ? Patient ID: Taylor Bailey, female    DOB: 1946/07/16, 76 y.o.   MRN: [id number] ? ?Chief Complaint  ?Patient presents with  ? Cough  ?  Pt c/o cough and drainage since 4/18. Has tried prednisone, doxycycline, nasal spray and promethazine which helped but symptoms didn't really go away. Negative Covid  ? ?HPI: ?Persistent cough:  reports having since 4/18, seen by UC and given abt, covid test negative, CXR negative, very active, no SOB, O2 sats at home good, having a lot of postnasal drip with drainage causing her to cough.  She has been using azelastine nasal spray daily, but does not seem to be controlling the mucus.  She has chronic mild wheezing she reports, from having her left lung partially removed. ? ?Assessment & Plan:  ? ?Problem List Items Addressed This Visit   ?None ?Visit Diagnoses   ? ? Allergic rhinitis with postnasal drip    -  Primary ?Patient reports clear mucus.  Has not been using Flonase nasal spray.  Advised to restart this with 1 spray each nostril twice a day for 1 week then reduce to daily use.  Advised to use saline nasal spray prior to using the Flonase and during the day as needed, as a good nasal flush.  Okay to also add an over-the-counter oral antihistamine as needed.  Drink at least 2 L of water daily.  ? ?  ? ?Outpatient Medications Prior to Visit  ?Medication Sig Dispense Refill  ? Azelastine HCl 137 MCG/SPRAY SOLN Place 2 sprays into both nostrils 2 (two) times daily.    ? Cholecalciferol (VITAMIN D) 50 MCG (2000 UT) CAPS Take 2,000 Units by mouth daily. Gummie    ? promethazine-dextromethorphan (PROMETHAZINE-DM) 6.25-15 MG/5ML syrup SMARTSIG:5-10 Milliliter(s) By Mouth PRN    ? fluticasone (FLONASE) 50 MCG/ACT nasal spray Place 2 sprays into both nostrils daily as needed for rhinitis.    ? ?No facility-administered medications prior to visit.  ? ?Past Medical History:  ?Diagnosis Date  ? Anxiety   ? mediatation- last time as a teenager  ? Cancer [facility])  2020  ? 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 Chicken pox   ? Complication of anesthesia 2015  ? Colonoscopy  ? Osteoporosis 10/2017  ? T score -2.9  ? Vitamin D deficiency   ? takes 2000 units each day  ? ? ?Past Surgical History:  ?Procedure Laterality Date  ? 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 of nose  ? BREAST BIOPSY Left   ? BENIGN  ? BREAST EXCISIONAL BIOPSY Left   ? BRONCHIAL NEEDLE ASPIRATION BIOPSY  08/28/2020  ? Procedure: BRONCHIAL NEEDLE ASPIRATION BIOPSIES;  Surgeon: Garner Nash, DO;  Location: [place];  Service: Pulmonary;;  ? CATARACT EXTRACTION, BILATERAL  2018  ? COLONOSCOPY    ? INTERCOSTAL NERVE BLOCK Left 10/01/2020  ? Procedure: INTERCOSTAL NERVE BLOCK;  Surgeon: Melrose Nakayama, MD;  Location: [place];  Service: Thoracic;  Laterality: Left;  ? IR THORACENTESIS ASP PLEURAL SPACE W/IMG GUIDE  10/30/2020  ? NODE DISSECTION Left 10/01/2020  ? Procedure: NODE DISSECTION;  Surgeon: Melrose Nakayama, MD;  Location: [place];  Service: Thoracic;  Laterality: Left;  ? TUBAL LIGATION    ? VIDEO BRONCHOSCOPY WITH ENDOBRONCHIAL ULTRASOUND Bilateral 08/28/2020  ? Procedure: VIDEO BRONCHOSCOPY WITH ENDOBRONCHIAL ULTRASOUND;  Surgeon: Garner Nash, DO;  Location: [place];  Service: Pulmonary;  Laterality: Bilateral;  ? WISDOM  TOOTH EXTRACTION    ? ? ?Allergies  ?Allergen Reactions  ? Fluoride Preparations   ?  FLU VACCINE  ? Influenza Vaccines   ?  Hives, angioedema- monitored in office on benadryl (received at [facility])   ? Prednisone   ?  Bad shakes/hyper after 2 days. Not sure about injections   ? ? ?   ?Objective:  ?  ?Physical Exam ?Vitals and nursing note reviewed.  ?Constitutional:   ?   Appearance: Normal appearance.  ?HENT:  ?   Right Ear: Tympanic membrane and ear canal normal.  ?   Left Ear: Tympanic membrane and ear canal normal.  ?   Nose:  ?   Right Sinus: No frontal sinus tenderness.  ?   Left Sinus: No frontal sinus tenderness.  ?    Mouth/Throat:  ?   Mouth: Mucous membranes are moist.  ?   Pharynx: Oropharyngeal exudate present. No pharyngeal swelling, posterior oropharyngeal erythema or uvula swelling.  ?Cardiovascular:  ?   Rate and Rhythm: Normal rate and regular rhythm.  ?Pulmonary:  ?   Effort: Pulmonary effort is normal.  ?   Breath sounds: Normal breath sounds.  ?Musculoskeletal:     ?   General: Normal range of motion.  ?Skin: ?   General: Skin is warm and dry.  ?Neurological:  ?   Mental Status: She is alert.  ?Psychiatric:     ?   Mood and Affect: Mood normal.     ?   Behavior: Behavior normal.  ? ? ?BP (!) 142/81 (BP Location: Left Arm, Patient Position: Sitting, Cuff Size: Large)   Pulse 69   Temp 97.6 ?F (36.4 ?C) (Temporal)   Ht 5\' 6"  (1.676 m)   Wt 158 lb 2 oz (71.7 kg)   SpO2 99%   BMI 25.52 kg/m?  ?Wt Readings from Last 3 Encounters:  ?01/05/22 158 lb 2 oz (71.7 kg)  ?11/10/21 160 lb 14.4 oz (73 kg)  ?08/30/21 158 lb (71.7 kg)  ? ?   ? ?Jeanie Sewer, NP ? ?

## 2022-01-12 ENCOUNTER — Ambulatory Visit
Admission: RE | Admit: 2022-01-12 | Discharge: 2022-01-12 | Disposition: A | Discharge status: Home / Self Care | Payer: Medicare Other | Source: Ambulatory Visit | Attending: Family Medicine | Admitting: Family Medicine

## 2022-01-12 DIAGNOSIS — Z1231 Encounter for screening mammogram for malignant neoplasm of breast: Secondary | ICD-10-CM

## 2022-03-23 ENCOUNTER — Telehealth: Payer: Self-pay

## 2022-03-23 NOTE — Telephone Encounter (Signed)
Patient called in voice mail stating she is just trying to schedule a Bone Density test.  Her last visit was AEX 07/26/2019 with Dr. Marguerita Merles.  She said her PCP does her gyn care now and she is current with Mammo 01/12/22 was negative (on file in chart).  She had her last BD test here 10/24/2017 and her PCP wants her to return here to have it.  Ok to place order.

## 2022-03-28 ENCOUNTER — Other Ambulatory Visit: Payer: Self-pay

## 2022-03-28 DIAGNOSIS — M818 Other osteoporosis without current pathological fracture: Secondary | ICD-10-CM

## 2022-03-28 NOTE — Telephone Encounter (Signed)
Taylor Bruins, MD  You 21 hours ago (11:13 AM)   Yes, thanks.      Order placed. Message sent to appointment desk to call patient to schedule.

## 2022-04-12 ENCOUNTER — Ambulatory Visit (INDEPENDENT_AMBULATORY_CARE_PROVIDER_SITE_OTHER): Payer: Medicare Other

## 2022-04-12 ENCOUNTER — Other Ambulatory Visit: Payer: Self-pay | Admitting: Obstetrics & Gynecology

## 2022-04-12 DIAGNOSIS — Z13820 Encounter for screening for osteoporosis: Secondary | ICD-10-CM

## 2022-04-12 DIAGNOSIS — M810 Age-related osteoporosis without current pathological fracture: Secondary | ICD-10-CM

## 2022-04-12 DIAGNOSIS — Z780 Asymptomatic menopausal state: Secondary | ICD-10-CM | POA: Diagnosis not present

## 2022-04-12 DIAGNOSIS — M818 Other osteoporosis without current pathological fracture: Secondary | ICD-10-CM

## 2022-04-13 ENCOUNTER — Telehealth: Payer: Self-pay | Admitting: Internal Medicine

## 2022-04-13 NOTE — Telephone Encounter (Signed)
Called patient regarding upcoming 2024 appointment, patient is notified.

## 2022-06-23 ENCOUNTER — Ambulatory Visit (INDEPENDENT_AMBULATORY_CARE_PROVIDER_SITE_OTHER): Payer: Medicare Other | Admitting: Family Medicine

## 2022-06-23 ENCOUNTER — Encounter: Payer: Self-pay | Admitting: Family Medicine

## 2022-06-23 VITALS — BP 120/68 | HR 86 | Temp 98.6°F | Ht 66.0 in | Wt 158.6 lb

## 2022-06-23 DIAGNOSIS — M546 Pain in thoracic spine: Secondary | ICD-10-CM | POA: Diagnosis not present

## 2022-06-23 DIAGNOSIS — R1012 Left upper quadrant pain: Secondary | ICD-10-CM

## 2022-06-23 DIAGNOSIS — G8929 Other chronic pain: Secondary | ICD-10-CM | POA: Diagnosis not present

## 2022-06-23 NOTE — Progress Notes (Signed)
Phone [phone] In person visit   Subjective:   Taylor Bailey is a 76 y.o. year old very pleasant female patient who presents for/with See problem oriented charting Chief Complaint  Patient presents with   Abdominal Pain    Pt c/o left abdominal pain x 2 weeks, denies nausea , vomiting,diarrhea, and constipation, pt states she is burping, antacids ease the feeling but do not totally relieve pain   Past Medical History-  Patient Active Problem List   Diagnosis Date Noted   Carcinoid tumor of left lung 09/08/2020    Priority: High   Aortic atherosclerosis ([place]) 08/30/2021    Priority: Medium    Hyperlipidemia, unspecified 04/02/2019    Priority: Medium    Osteoporosis 10/2017    Priority: Medium    S/P lobectomy of lung 10/01/2020    Priority: Low   Vitamin D deficiency     Priority: Low    Medications- reviewed and updated Current Outpatient Medications  Medication Sig Dispense Refill   Cholecalciferol (VITAMIN D) 50 MCG (2000 UT) CAPS Take 2,000 Units by mouth daily. Gummie     No current facility-administered medications for this visit.     Objective:  BP 120/68   Pulse 86   Temp 98.6 F (37 C)   Ht 5\' 6"  (1.676 m)   Wt 158 lb 9.6 oz (71.9 kg)   SpO2 96%   BMI 25.60 kg/m  Gen: NAD, resting comfortably, some audible wheezing per baseline after lobectomy CV: RRR no murmurs rubs or gallops Lungs: CTAB no crackles, wheeze, rhonchi Abdomen: soft/nontender/nondistended/normal bowel sounds. No rebound or guarding.  Unable to reproduce pain today Ext: no edema Skin: warm, dry     Assessment and Plan   #Left upper quadrant abdominal pain radiating to left thoracic spine S: Patient with on and off issues on this side since robotic left upper lobectomy and node dissection on 10/01/2020 for carcinoid tumor of the left lung. Dr. Julien Nordmann has weighed in that neuropathic pain after thoracic surgery is very common and can last for years- recommended as needed  tylenol or ibuprofen or gabapentin if needed -No obvious abnormalities on CT scans of the chest most recently 11/08/2018.  Currently, Patient complains of left-sided abdominal pain for 2 weeks (very similar wave of pain to his prior pain)- LUQ and radiates around to her back.  No nausea, vomiting, diarrhea.  No constipation.  Having a lot of burping/belching.  Antacids ease the feeling but do not totally relieve the pain-she is currently taking generic Tums that helps burping but not the pain . 1-2  pain aggravation/worry. 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  Has had multiple CTs of the chest but not into the abdomen-that would rule out diverticulitis though suspicion is low and evaluate for any potential tumor burden in the abdomen which I also think is less likely given intermittent issues since her surgery and unchanged pain pattern.-We could also consider thoracic spine films but would get some imaging of orthopedic anatomy with CT  abdomen and pelvis.  Could also try heat From AVS "  Patient Instructions  I think its reasonable to take ibuprofen for a week or aleve since lasts 12 hours twice a day for 7 days and see if it helps -we could try gabapentin for more targeted nerve pain reduction -also can scan abdomen if you desire- you can reach out about any of these options -with using aleve for 1-2 weeks- consider taking either pepcid/famotidine before breakfast and dinner or omeprazole once a day before dinner.     Recommended follow up: Return for next already scheduled visit or sooner if needed. "   Recommended follow up: Return for next already scheduled visit or sooner if needed. Future Appointments  Date Time Provider [place]  08/26/2022 11:00 AM [name], MD [facility]  10/06/2022  1:45 PM [facility] HEALTH COACH [facility]  11/07/2022 11:00 AM [facility] None  11/09/2022 10:30 AM Curt Bears, MD [facility] None    Lab/Order associations:   ICD-10-CM   1. Left upper quadrant pain  R10.12     2. Chronic left-sided thoracic back pain  M54.6    G89.29      Return precautions advised.  Garret Reddish, MD

## 2022-06-23 NOTE — Patient Instructions (Addendum)
I think its reasonable to take ibuprofen for a week or aleve since lasts 12 hours twice a day for 7 days and see if it helps -we could try gabapentin for more targeted nerve pain reduction -also can scan abdomen if you desire- you can reach out about any of these options -with using aleve for 1-2 weeks- consider taking either pepcid/famotidine before breakfast and dinner or omeprazole once a day before dinner.    Recommended follow up: Return for next already scheduled visit or sooner if needed.

## 2022-08-26 ENCOUNTER — Encounter: Payer: Self-pay | Admitting: Family Medicine

## 2022-08-26 ENCOUNTER — Ambulatory Visit (INDEPENDENT_AMBULATORY_CARE_PROVIDER_SITE_OTHER): Payer: Medicare Other | Admitting: Family Medicine

## 2022-08-26 DIAGNOSIS — E785 Hyperlipidemia, unspecified: Secondary | ICD-10-CM

## 2022-08-26 DIAGNOSIS — I700 Atherosclerosis of aorta: Secondary | ICD-10-CM

## 2022-08-26 NOTE — Progress Notes (Signed)
Phone [phone] In person visit   Subjective:   Taylor Bailey is a 76 y.o. 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 Gummie     No current facility-administered medications for this visit.     Objective:  BP 132/78   Pulse 73   Temp 98.1 F (36.7 C)   Ht 5\' 6"  (1.676 m)   Wt 162 lb (73.5 kg)   SpO2 96%   BMI 26.15 kg/m  Gen: NAD, resting comfortably Some upper respiratory wheeze CV: RRR no murmurs rubs or gallops Lungs: CTAB no crackles, wheeze, rhonchi Abdomen: soft/nontender/nondistended/normal bowel sounds. No rebound or guarding.  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   -Lingering neuropathic pain-we think this could also be related to her left upper quadrant pain that she had back in October. Sitting for long time can eb a trigger. Still walking 20-25 miles a week walking - Most recent scan on 11/08/21 and overall stable   #hyperlipidemia/aortic atherosclerosis S: Medication: None -Patient has wanted to stay off medication unless significant worsening of numbers.  No plaque on heart on CT noted thankfully but is aware  aortic atherosclerosis confers risk factor Lab Results  Component Value Date   CHOL 235 (H) 08/30/2021   HDL 77.50 08/30/2021   LDLCALC 143 (H) 08/30/2021   TRIG 74.0 08/30/2021   CHOLHDL 3 08/30/2021   A/P: Hopefully improved.  Reassuring CT scan without calcium each year gives [place] some confidence-if this progresses we may change our plan.  She knows aortic atherosclerosis is an independent risk factor for cardiovascular disease but wants to remain off meds and make tweaks if needed in diet -I do not think CT calcium scoring would be particularly helpful given already getting a CT scan every year  #Some loose stools- drinks a lot of fluids and eats a lot of fiber- thinks this could be contributing. Going on since June. No pencil thin stools. No unintentional weight loss.  -We discussed referring back to GI for their opinion on colonoscopy-she declines at this time but will reach out if worsening symptoms  #GERD- pepcid as needed OTC- dietary changes have been helpful  # Osteoporosis S: Last DEXA: 04/12/22 with Dr. Dellis Filbert- stable ot improved so opted out of meds per Dr. Dellis Filbert note  Medication (bisphosphonate or prolia): none  Calcium: 1200mg  (through diet ok) recommended - yes Vitamin D: 1000 units a day recommended- yes  Last vitamin D Lab Results  Component Value Date   VD25OH 66.90 08/30/2021  A/P: Has been well-controlled-update when she comes back for labs  # HM due-  prior severe flu shot reaction- holding off on flu, prevnar 20, rsv, covid- prefers to stay out of crowds and use kn95 if has to go out- knows not a perfect prevention strategy but has worked so far for her. .  -has had Tdap with [employer]- she will give [place] the date Immunization History  Administered Date(s) Administered   Moderna Sars-Covid-2 Vaccination 11/13/2019, 12/11/2019, 07/16/2020    Recommended follow up: Return in about 1 year (around 08/27/2023) for followup or sooner if needed.Schedule b4 you  leave. Future Appointments  Date Time Provider [place]  09/08/2022  8:30 AM [facility]  10/06/2022  1:45 PM [facility] HEALTH COACH [facility]  11/07/2022 11:00 AM [facility] None  11/09/2022 10:30 AM Curt Bears, MD [facility] None  08/29/2023 10:00 AM [name], MD [facility]    Lab/Order associations: scrambled egg with cottage cheese, toast but will come back fasting   ICD-10-CM   1. Hyperlipidemia, unspecified hyperlipidemia type  E78.5 CBC with Differential/Platelet    Comprehensive metabolic panel    Lipid panel    CANCELED: CBC with Differential/Platelet    CANCELED: Comprehensive metabolic panel    CANCELED: Lipid panel    2. Aortic atherosclerosis (HCC) Chronic I70.0       Time Spent: 25 minutes of total time (11:30 AM-11:55 AM) was spent on the date of the encounter performing the following actions: chart review prior to seeing the patient, obtaining history, performing a medically necessary exam, counseling on the treatment plan as well as health maintenance discussions, placing orders, and documenting in our EHR.    Return precautions advised.  Garret Reddish, MD

## 2022-08-26 NOTE — Patient Instructions (Addendum)
No changes today unless labs lead [place] to make changes  Please stop by lab before you go If you have mychart- we will send your results within 3 business days of [place] receiving them.  If you do not have mychart- we will call you about results within 5 business days of [place] receiving them.  *please also note that you will see labs on mychart as soon as they post. I will later go in and write notes on them- will say "notes from Dr. Yong Channel"   Recommended follow up: Return in about 1 year (around 08/27/2023) for followup or sooner if needed.Schedule b4 you leave.

## 2022-09-08 ENCOUNTER — Other Ambulatory Visit (INDEPENDENT_AMBULATORY_CARE_PROVIDER_SITE_OTHER): Payer: Medicare Other

## 2022-09-08 DIAGNOSIS — E785 Hyperlipidemia, unspecified: Secondary | ICD-10-CM | POA: Diagnosis not present

## 2022-09-08 LAB — LIPID PANEL
Cholesterol: 224 mg/dL — ABNORMAL HIGH (ref 0–200)
HDL: 77.2 mg/dL (ref >39.00)
LDL Cholesterol: 130 mg/dL — ABNORMAL HIGH (ref 0–99)
NonHDL: 146.63
Total CHOL/HDL Ratio: 3
Triglycerides: 82 mg/dL (ref 0.0–149.0)
VLDL: 16.4 mg/dL (ref 0.0–40.0)

## 2022-09-08 LAB — COMPREHENSIVE METABOLIC PANEL
ALT: 10 U/L (ref 0–35)
AST: 16 U/L (ref 0–37)
Albumin: 4.3 g/dL (ref 3.5–5.2)
Alkaline Phosphatase: 59 U/L (ref 39–117)
BUN: 11 mg/dL (ref 6–23)
CO2: 30 mEq/L (ref 19–32)
Calcium: 9.9 mg/dL (ref 8.4–10.5)
Chloride: 104 mEq/L (ref 96–112)
Creatinine, Ser: 0.72 mg/dL (ref 0.40–1.20)
GFR: 81.59 mL/min (ref >60.00)
Glucose, Bld: 99 mg/dL (ref 70–99)
Potassium: 4.3 mEq/L (ref 3.5–5.1)
Sodium: 140 mEq/L (ref 135–145)
Total Bilirubin: 0.5 mg/dL (ref 0.2–1.2)
Total Protein: 7.2 g/dL (ref 6.0–8.3)

## 2022-09-08 LAB — CBC WITH DIFFERENTIAL/PLATELET
Basophils Absolute: 0 10*3/uL (ref 0.0–0.1)
Basophils Relative: 0.8 % (ref 0.0–3.0)
Eosinophils Absolute: 0.1 10*3/uL (ref 0.0–0.7)
Eosinophils Relative: 1.4 % (ref 0.0–5.0)
HCT: 40.1 % (ref 36.0–46.0)
Hemoglobin: 13.4 g/dL (ref 12.0–15.0)
Lymphocytes Relative: 37.9 % (ref 12.0–46.0)
Lymphs Abs: 2.4 10*3/uL (ref 0.7–4.0)
MCHC: 33.4 g/dL (ref 30.0–36.0)
MCV: 86.1 fl (ref 78.0–100.0)
Monocytes Absolute: 0.5 10*3/uL (ref 0.1–1.0)
Monocytes Relative: 8.5 % (ref 3.0–12.0)
Neutro Abs: 3.2 10*3/uL (ref 1.4–7.7)
Neutrophils Relative %: 51.4 % (ref 43.0–77.0)
Platelets: 233 10*3/uL (ref 150.0–400.0)
RBC: 4.65 Mil/uL (ref 3.87–5.11)
RDW: 14.1 % (ref 11.5–15.5)
WBC: 6.2 10*3/uL (ref 4.0–10.5)

## 2022-09-28 IMAGING — CT CT CARDIAC CORONARY ARTERY CALCIUM SCORE
1 series · 15 of 20 positions shown, 19 images · non-contrast
Comparison: Chest CT 09/25/2012.

CLINICAL DATA: 74-year-old female with history of orthostatic
dizziness.

EXAM:
CT CARDIAC CORONARY ARTERY CALCIUM SCORE
TECHNIQUE: Non-contrast imaging through the heart was performed using
prospective ECG gating. Image post processing was performed on an
independent workstation, allowing for quantitative analysis of the
heart and coronary arteries. Note that this exam targets the heart
and the chest was not imaged in its entirety.

[Series 3: cascseq 2.0 b35f 70% · axial · 0.39mm/px · z∈[-266,-144]mm · 15 of 69 slices shown, 19 images]
[im 4/69  vessel]
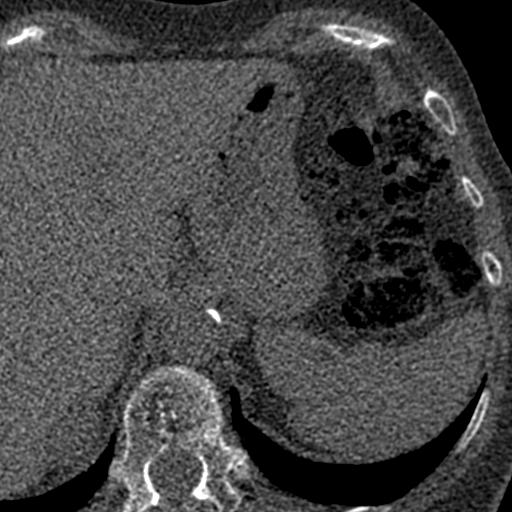
[im 4/69  lung]
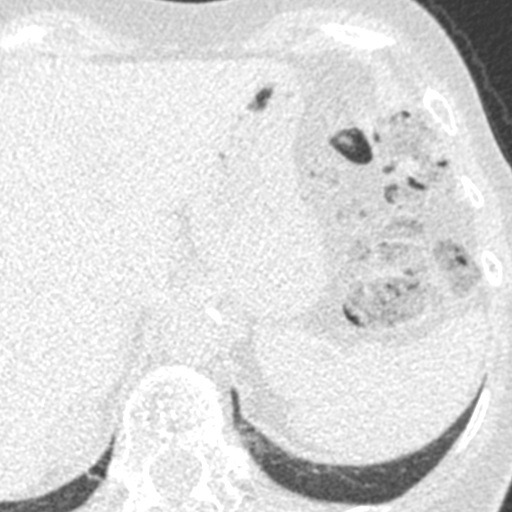
[im 8/69  vessel]
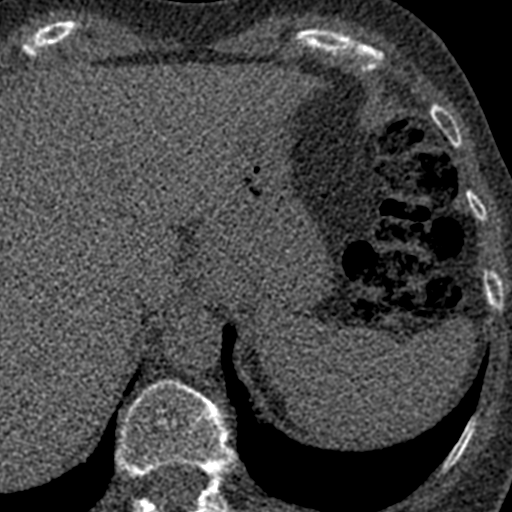
[im 15/69  vessel]
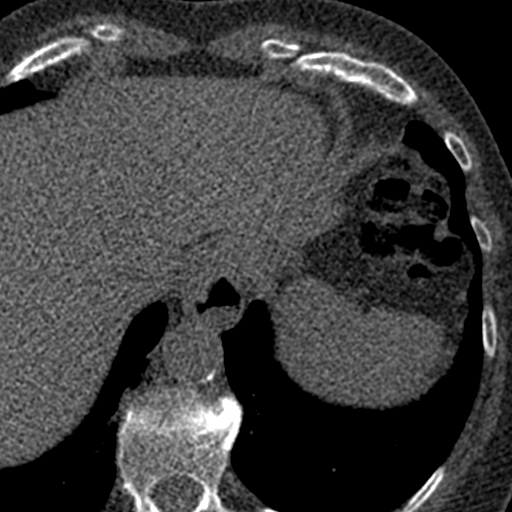
[im 18/69  vessel]
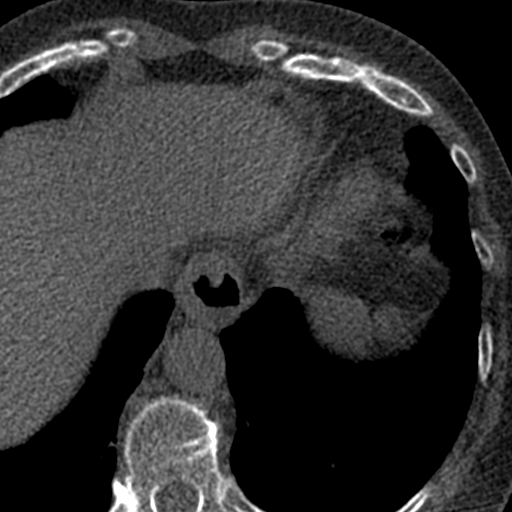
[im 22/69  vessel]
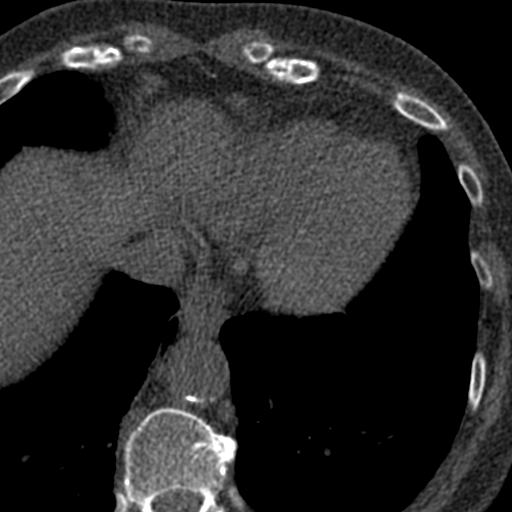
[im 22/69  lung]
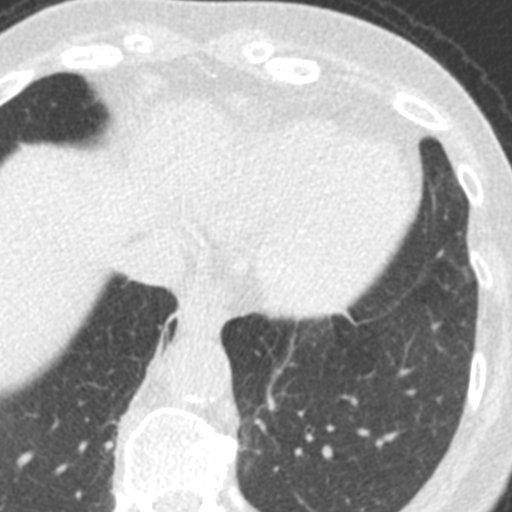
[im 26/69  vessel]
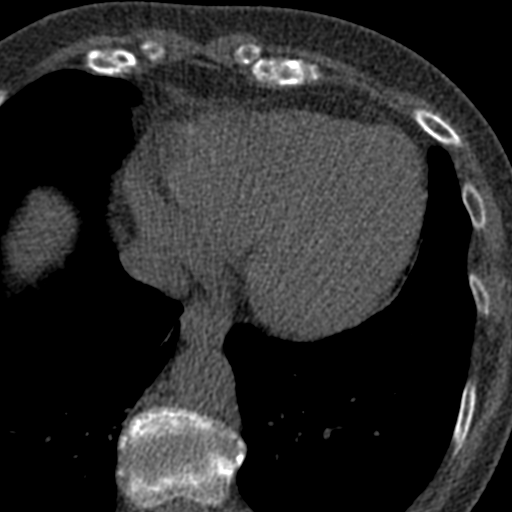
[im 29/69  vessel]
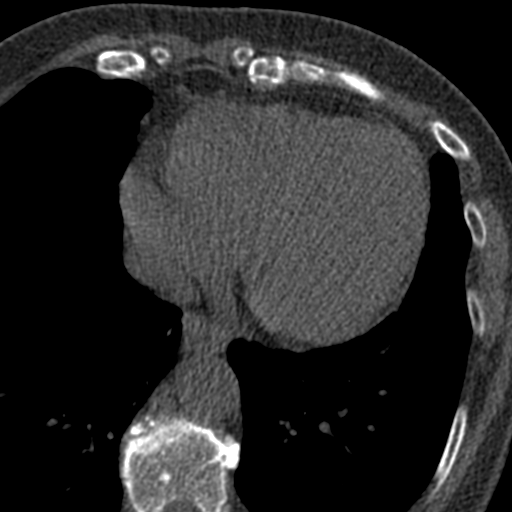
[im 36/69  vessel]
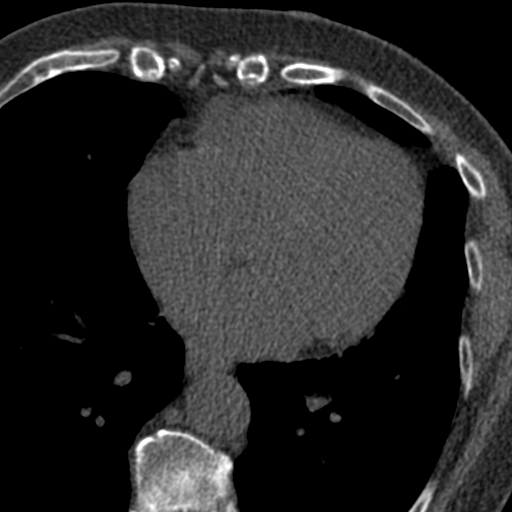
[im 40/69  vessel]
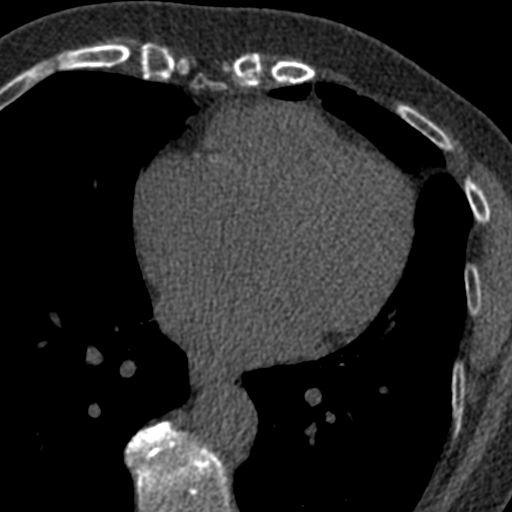
[im 40/69  lung]
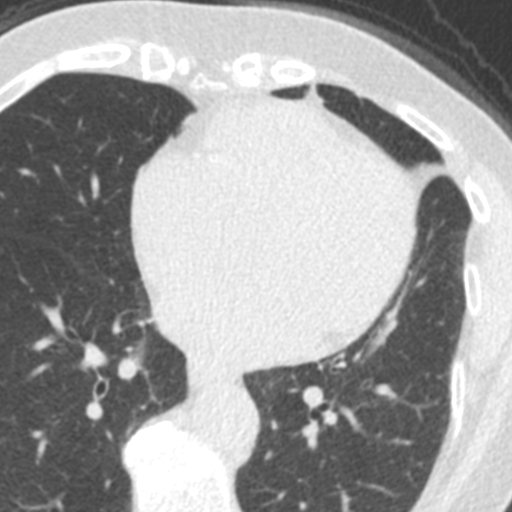
[im 43/69  vessel]
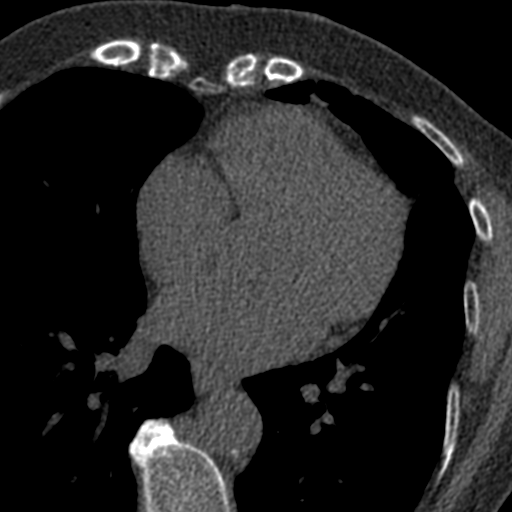
[im 47/69  vessel]
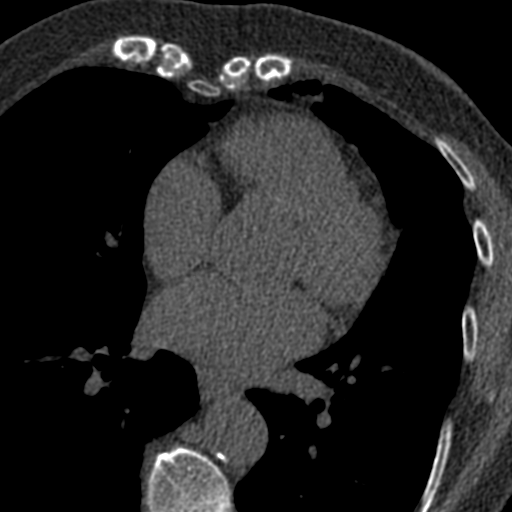
[im 51/69  vessel]
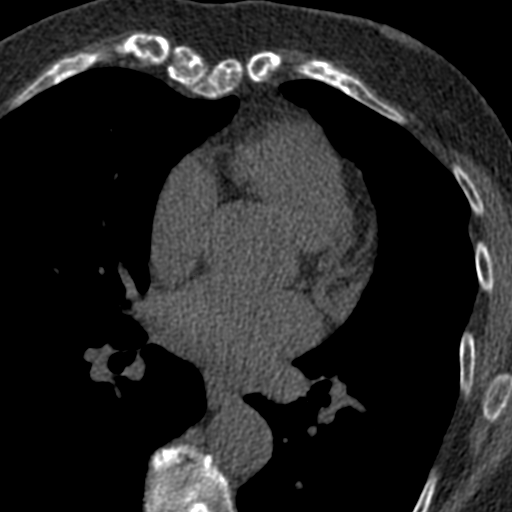
[im 58/69  vessel]
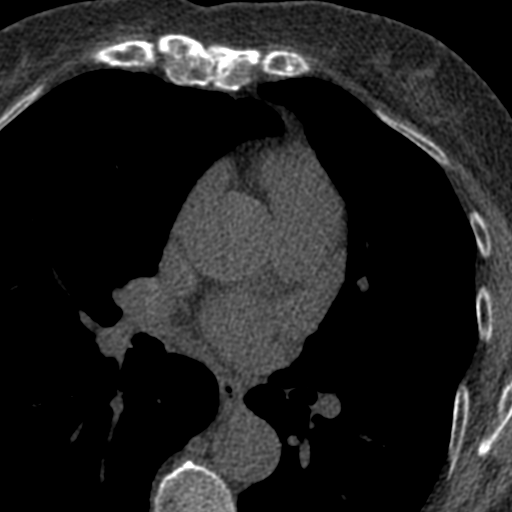
[im 58/69  lung]
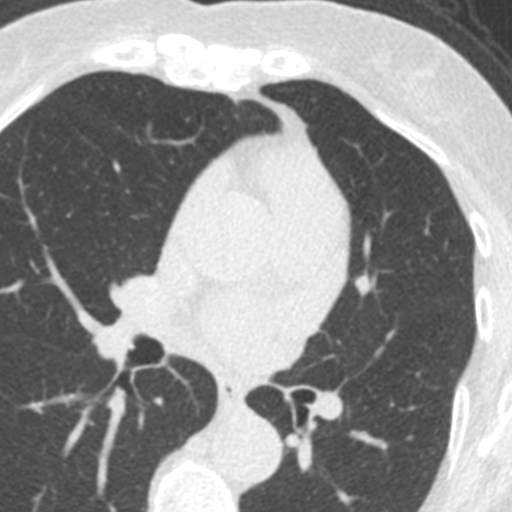
[im 61/69  vessel]
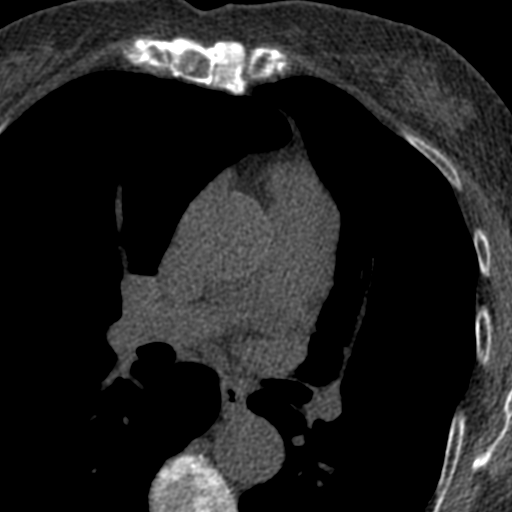
[im 65/69  vessel]
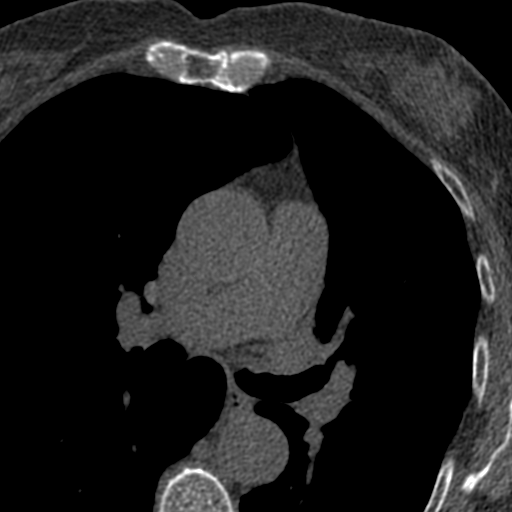

[15 of 20 positions shown; findings below may reference images not displayed]

FINDINGS: CORONARY CALCIUM SCORES:

Left Main: 0

LAD: 0

LCx: 0

RCA: 0

Total Agatston Score: 0

[HOSPITAL] percentile: N/A

AORTA MEASUREMENTS:

Ascending Aorta: 35 mm

Descending Aorta: 29 mm

EXTRACARDIAC FINDINGS:

In the periphery of the left upper lobe there is an incompletely
imaged mass-like area measuring 3.4 x 3.3 cm, which is new compared
to prior study from 8197. Fullness in the left hilar region may
reflect lymphadenopathy. Visualized portions of the upper abdomen
are unremarkable. There are no aggressive appearing lytic or blastic
lesions noted in the visualized portions of the skeleton.
IMPRESSION: 1. Patient's total coronary artery calcium score is 0 which
indicates a very low (but nonzero) risk of major adverse
cardiovascular events over the next 10 years.
2. Incompletely imaged masslike area in the posterior aspect of the
left upper lobe. Although this could simply represent a focus of
airspace consolidation from pneumonia, there is also fullness in the
left hilar region which may reflect underlying lymphadenopathy, and
further evaluation with contrast enhanced chest CT is strongly
recommended at this time to better evaluate this finding and
establish a baseline for future follow-up examinations.

These results will be called to the ordering clinician or
representative by the Radiologist Assistant, and communication
documented in the PACS or [REDACTED].

## 2022-10-06 ENCOUNTER — Ambulatory Visit: Payer: Medicare Other

## 2022-11-07 ENCOUNTER — Ambulatory Visit (HOSPITAL_COMMUNITY)
Admission: RE | Admit: 2022-11-07 | Discharge: 2022-11-07 | Disposition: A | Discharge status: Home / Self Care | Payer: Medicare Other | Source: Ambulatory Visit | Attending: Internal Medicine | Admitting: Internal Medicine

## 2022-11-07 ENCOUNTER — Inpatient Hospital Stay: Payer: Medicare Other | Attending: Internal Medicine

## 2022-11-07 ENCOUNTER — Encounter (HOSPITAL_COMMUNITY): Payer: Self-pay

## 2022-11-07 DIAGNOSIS — Z85828 Personal history of other malignant neoplasm of skin: Secondary | ICD-10-CM | POA: Diagnosis not present

## 2022-11-07 DIAGNOSIS — C3490 Malignant neoplasm of unspecified part of unspecified bronchus or lung: Secondary | ICD-10-CM

## 2022-11-07 DIAGNOSIS — I700 Atherosclerosis of aorta: Secondary | ICD-10-CM | POA: Diagnosis not present

## 2022-11-07 DIAGNOSIS — C7A1 Malignant poorly differentiated neuroendocrine tumors: Secondary | ICD-10-CM | POA: Insufficient documentation

## 2022-11-07 DIAGNOSIS — E041 Nontoxic single thyroid nodule: Secondary | ICD-10-CM | POA: Diagnosis not present

## 2022-11-07 LAB — CBC WITH DIFFERENTIAL (CANCER CENTER ONLY)
Abs Immature Granulocytes: 0.02 10*3/uL (ref 0.00–0.07)
Basophils Absolute: 0 10*3/uL (ref 0.0–0.1)
Basophils Relative: 1 %
Eosinophils Absolute: 0.1 10*3/uL (ref 0.0–0.5)
Eosinophils Relative: 1 %
HCT: 38 % (ref 36.0–46.0)
Hemoglobin: 12.4 g/dL (ref 12.0–15.0)
Immature Granulocytes: 0 %
Lymphocytes Relative: 29 %
Lymphs Abs: 1.7 10*3/uL (ref 0.7–4.0)
MCH: 28.3 pg (ref 26.0–34.0)
MCHC: 32.6 g/dL (ref 30.0–36.0)
MCV: 86.8 fL (ref 80.0–100.0)
Monocytes Absolute: 0.5 10*3/uL (ref 0.1–1.0)
Monocytes Relative: 9 %
Neutro Abs: 3.3 10*3/uL (ref 1.7–7.7)
Neutrophils Relative %: 60 %
Platelet Count: 237 10*3/uL (ref 150–400)
RBC: 4.38 MIL/uL (ref 3.87–5.11)
RDW: 13.8 % (ref 11.5–15.5)
WBC Count: 5.7 10*3/uL (ref 4.0–10.5)
nRBC: 0 % (ref 0.0–0.2)

## 2022-11-07 LAB — CMP (CANCER CENTER ONLY)
ALT: 9 U/L (ref 0–44)
AST: 17 U/L (ref 15–41)
Albumin: 4.2 g/dL (ref 3.5–5.0)
Alkaline Phosphatase: 63 U/L (ref 38–126)
Anion gap: 5 (ref 5–15)
BUN: 17 mg/dL (ref 8–23)
CO2: 29 mmol/L (ref 22–32)
Calcium: 8.9 mg/dL (ref 8.9–10.3)
Chloride: 106 mmol/L (ref 98–111)
Creatinine: 0.76 mg/dL (ref 0.44–1.00)
GFR, Estimated: >60 mL/min (ref >60)
Glucose, Bld: 91 mg/dL (ref 70–99)
Potassium: 4 mmol/L (ref 3.5–5.1)
Sodium: 140 mmol/L (ref 135–145)
Total Bilirubin: 0.3 mg/dL (ref 0.3–1.2)
Total Protein: 6.8 g/dL (ref 6.5–8.1)

## 2022-11-07 MED ORDER — SODIUM CHLORIDE (PF) 0.9 % IJ SOLN
INTRAMUSCULAR | Status: AC
  Filled 2022-11-07: qty 50

## 2022-11-07 MED ORDER — IOHEXOL 300 MG/ML  SOLN
75.0000 mL | Freq: Once | INTRAMUSCULAR | Status: AC | PRN
  Administered 2022-11-07: 75 mL via INTRAVENOUS

## 2022-11-09 ENCOUNTER — Inpatient Hospital Stay (HOSPITAL_BASED_OUTPATIENT_CLINIC_OR_DEPARTMENT_OTHER): Payer: Medicare Other | Admitting: Internal Medicine

## 2022-11-09 VITALS — BP 127/100 | HR 88 | Temp 98.2°F | Resp 17 | Wt 161.4 lb

## 2022-11-09 DIAGNOSIS — C3490 Malignant neoplasm of unspecified part of unspecified bronchus or lung: Secondary | ICD-10-CM

## 2022-11-09 DIAGNOSIS — C7A1 Malignant poorly differentiated neuroendocrine tumors: Secondary | ICD-10-CM | POA: Diagnosis not present

## 2022-11-09 NOTE — Progress Notes (Signed)
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 Roxan Hockey on October 01, 2020.  CURRENT THERAPY: Observation  INTERVAL HISTORY: Taylor Bailey 77 y.o. female returns to the clinic today for follow-up visit.  The patient is feeling fine today with no concerning complaints.  She exercises at regular basis.  She denied having any current chest pain, shortness of breath, cough or hemoptysis.  She has no nausea, vomiting, diarrhea or constipation.  She has no headache or visual changes.  She is here today for evaluation with repeat CT scan of the chest for restaging of her disease.  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  MEDICATIONS:  Current Outpatient Medications  Medication Sig Dispense Refill   Cholecalciferol (VITAMIN D) 50 MCG (2000 UT) CAPS Take 2,000 Units by mouth daily. Gummie     No current facility-administered medications for this visit.    SURGICAL HISTORY:  Past Surgical History:  Procedure Laterality Date   basal cell carcinoma removal  07/26/2021   of nose   BREAST BIOPSY Left    BENIGN   BREAST EXCISIONAL BIOPSY Left    BRONCHIAL NEEDLE ASPIRATION BIOPSY  08/28/2020   Procedure: BRONCHIAL NEEDLE ASPIRATION  BIOPSIES;  Surgeon: Garner Nash, DO;  Location: [facility];  Service: Pulmonary;;   CATARACT EXTRACTION, BILATERAL  2018   COLONOSCOPY     INTERCOSTAL NERVE BLOCK Left 10/01/2020   Procedure: INTERCOSTAL NERVE BLOCK;  Surgeon: Melrose Nakayama, MD;  Location: [facility];  Service: Thoracic;  Laterality: Left;   IR THORACENTESIS ASP PLEURAL SPACE W/IMG GUIDE  10/30/2020   NODE DISSECTION Left 10/01/2020   Procedure: NODE DISSECTION;  Surgeon: Melrose Nakayama, MD;  Location: [facility];  Service: Thoracic;  Laterality: Left;   TUBAL LIGATION     VIDEO BRONCHOSCOPY WITH ENDOBRONCHIAL ULTRASOUND Bilateral 08/28/2020   Procedure: VIDEO BRONCHOSCOPY WITH ENDOBRONCHIAL ULTRASOUND;  Surgeon: Garner Nash, DO;  Location: [facility];  Service: Pulmonary;  Laterality: Bilateral;   WISDOM TOOTH EXTRACTION      REVIEW OF SYSTEMS:  A comprehensive review of systems was negative.   PHYSICAL EXAMINATION: General appearance: alert, cooperative, and no distress Head: Normocephalic, without obvious abnormality, atraumatic Neck: no adenopathy, no JVD, supple, symmetrical, trachea midline, and thyroid not enlarged, symmetric, no tenderness/mass/nodules Lymph nodes: Cervical, supraclavicular, and axillary nodes normal. Resp: clear to auscultation bilaterally Back: symmetric, no curvature. ROM normal. No CVA tenderness. Cardio: regular rate and rhythm, S1, S2 normal, no murmur, click, rub or gallop GI: soft, non-tender; bowel sounds normal; no masses,  no organomegaly Extremities: extremities normal, atraumatic, no cyanosis or edema  ECOG PERFORMANCE STATUS: 0 - Asymptomatic  Blood pressure (!) 127/100, pulse 88,  temperature 98.2 F (36.8 C), temperature source Oral, resp. rate 17, weight 161 lb 6 oz (73.2 kg), SpO2 100 %.  LABORATORY DATA: Lab Results  Component Value Date   WBC 5.7 11/07/2022   HGB 12.4 11/07/2022   HCT 38.0 11/07/2022   MCV 86.8 11/07/2022   PLT 237 11/07/2022       Chemistry      Component Value Date/Time   NA 140 11/07/2022 1029   K 4.0 11/07/2022 1029   CL 106 11/07/2022 1029   CO2 29 11/07/2022 1029   BUN 17 11/07/2022 1029   CREATININE 0.76 11/07/2022 1029      Component Value Date/Time   CALCIUM 8.9 11/07/2022 1029   ALKPHOS 63 11/07/2022 1029   AST 17 11/07/2022 1029   ALT 9 11/07/2022 1029   BILITOT 0.3 11/07/2022 1029       RADIOGRAPHIC STUDIES: CT Chest W Contrast  Result Date: 11/08/2022 CLINICAL DATA:  77 year old female presents with history of non non-small cell lung cancer. * Tracking Code: BO * EXAM: CT CHEST WITH CONTRAST TECHNIQUE: Multidetector CT imaging of the chest was performed during intravenous contrast administration. RADIATION DOSE REDUCTION: This exam was performed according to the departmental dose-optimization program which includes automated exposure control, adjustment of the mA and/or kV according to patient size and/or use of iterative reconstruction technique. CONTRAST:  55m OMNIPAQUE IOHEXOL 300 MG/ML  SOLN COMPARISON:  November 08, 2021 FINDINGS: Cardiovascular: Stable appearance of heart great vessels. Signs of aortic atherosclerosis are mild. Mediastinum/Nodes: Distortion of LEFT hilum following LEFT upper lobectomy. No signs of adenopathy in the chest. New hypodense lesion in the RIGHT hemi thyroid 15 mm. Lungs/Pleura: Post LEFT upper lobectomy. No consolidation. No sign of pleural effusion. Airways are patent. Stable 5 mm ground-glass nodule in the RIGHT middle lobe (image 104/7). 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 No acute upper abdominal findings. Musculoskeletal: No acute bone finding. No destructive bone process. Spinal degenerative changes. IMPRESSION: 1. Post LEFT upper lobectomy.  No new or progressive findings. 2. RIGHT lower lobe nodule (image 104/7) 5 mm, not changed. Attention on follow-up. 3. New hypodense lesion in the RIGHT hemi thyroid 15 mm.  Given that this is a new finding would suggest ultrasound for further evaluation with sampling as warranted. 4. 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 5. Aortic atherosclerosis. Aortic Atherosclerosis (ICD10-I70.0). Electronically Signed   By: GZetta BillsM.D.   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 HRoxan Hockeyon October 01, 2020. The patient is currently on observation and she is feeling fine except for intermittent pain on the left side of the chest from the surgical scar. She had repeat CT scan of the chest performed recently.  I personally and independently reviewed the scan and discussed the result with the patient today. Her scan showed no concerning finding for disease recurrence or metastasis but there was new right hemithyroid nodule. I recommended for the patient to continue on observation with repeat CT scan of the chest in 1 year. Regarding the right thyroid nodule, I will order ultrasound of the thyroid gland and if concerning we will order fine-needle aspiration and then referral to surgery. The patient was  advised to call immediately if she has any other concerning symptoms in the interval.  The patient voices understanding of current disease status and treatment options and is in agreement with the current care plan.  All questions were answered. The patient knows to call the clinic with any problems, questions or concerns. We can certainly see the patient much sooner if necessary.  Disclaimer: This note was dictated with voice recognition software. Similar sounding words can inadvertently be transcribed and may not be corrected upon review.

## 2022-11-11 ENCOUNTER — Ambulatory Visit (HOSPITAL_COMMUNITY)
Admission: RE | Admit: 2022-11-11 | Discharge: 2022-11-11 | Disposition: A | Discharge status: Home / Self Care | Payer: Medicare Other | Source: Ambulatory Visit | Attending: Internal Medicine | Admitting: Internal Medicine

## 2022-11-11 DIAGNOSIS — C3490 Malignant neoplasm of unspecified part of unspecified bronchus or lung: Secondary | ICD-10-CM | POA: Insufficient documentation

## 2022-12-03 IMAGING — DX DG CHEST 2V
2 series · 2 of 2 positions shown · non-contrast
Comparison: 10/04/2020

CLINICAL DATA: Status post lobectomy

EXAM:
CHEST - 2 VIEW

[chest lat]
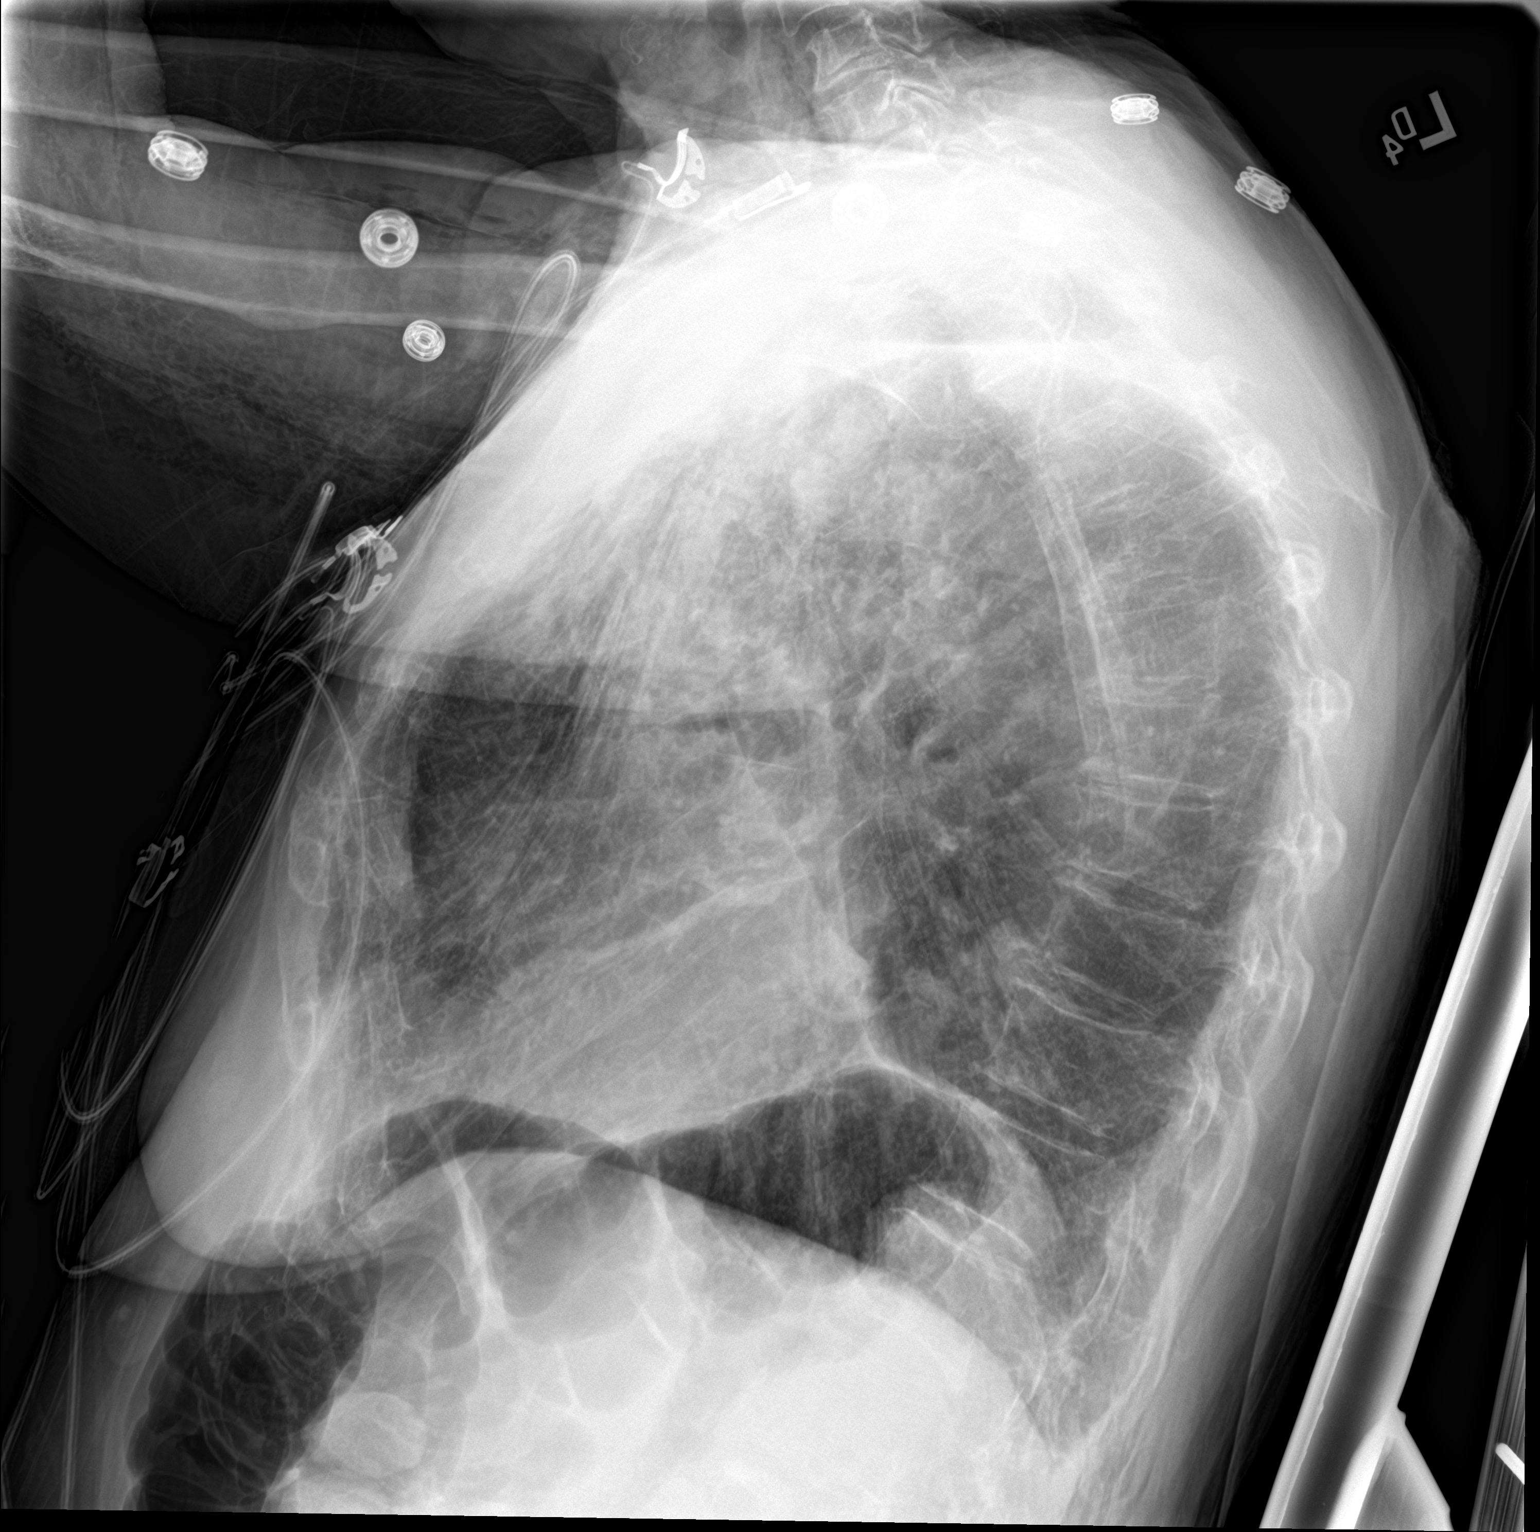

[chest ap]
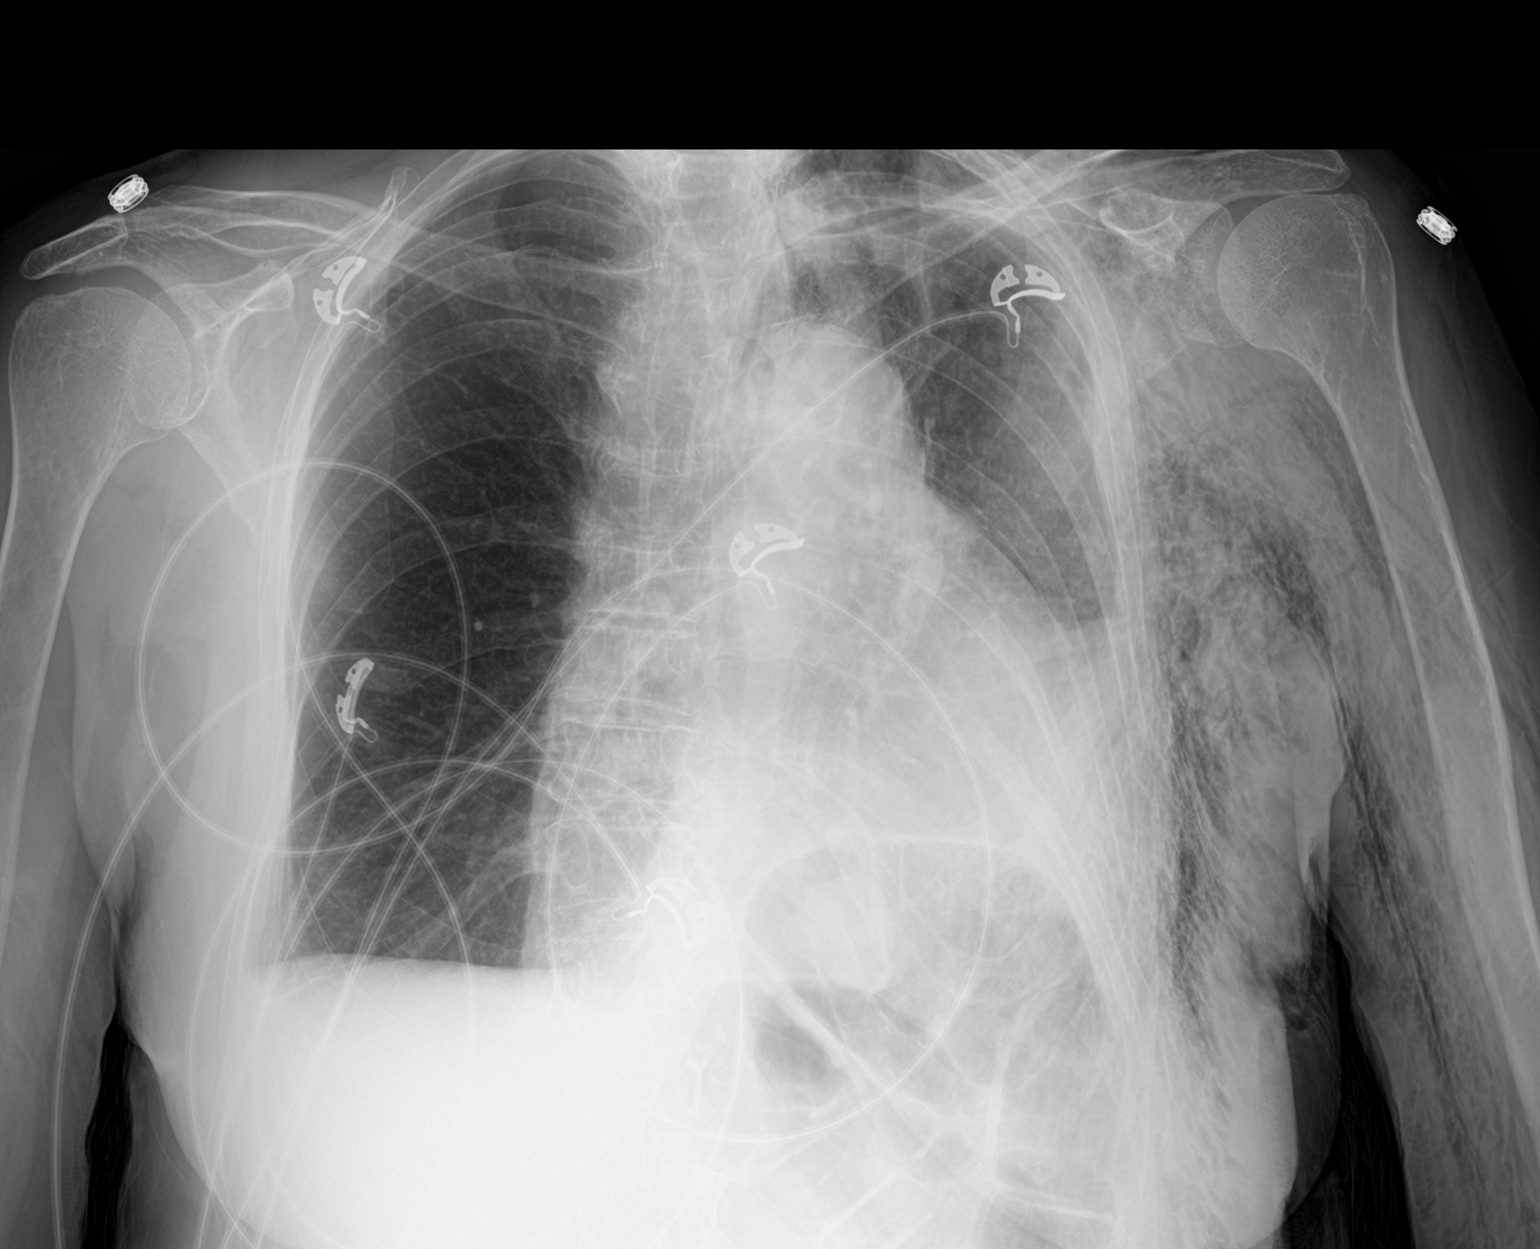

[2 of 2 positions shown; findings below may reference images not displayed]

FINDINGS: Left-sided volume loss with elevation of the left hemidiaphragm is
again seen in keeping with history of partial left lung resection.
Lungs are clear. No pneumothorax or pleural effusion. Extensive left
chest wall and neck base subcutaneous gas appears stable since prior
examination. Cardiac size is within normal limits.
IMPRESSION: Status post partial left lung resection. Stable extensive
subcutaneous gas. No pneumothorax.

## 2022-12-06 ENCOUNTER — Other Ambulatory Visit: Payer: Self-pay | Admitting: Family Medicine

## 2022-12-06 DIAGNOSIS — Z1231 Encounter for screening mammogram for malignant neoplasm of breast: Secondary | ICD-10-CM

## 2022-12-26 ENCOUNTER — Telehealth: Payer: Self-pay | Admitting: Family Medicine

## 2022-12-26 NOTE — Telephone Encounter (Signed)
Contacted [name] to schedule their annual wellness visit. Patient declined to schedule AWV at this time.  Does not want to participate.  Gabriel Cirri [facility] AWV TEAM Direct Dial [phone]

## 2023-01-20 ENCOUNTER — Ambulatory Visit
Admission: RE | Admit: 2023-01-20 | Discharge: 2023-01-20 | Disposition: A | Discharge status: Home / Self Care | Payer: Medicare Other | Source: Ambulatory Visit | Attending: Family Medicine | Admitting: Family Medicine

## 2023-01-20 DIAGNOSIS — Z1231 Encounter for screening mammogram for malignant neoplasm of breast: Secondary | ICD-10-CM

## 2023-03-27 ENCOUNTER — Ambulatory Visit: Payer: Medicare Other | Admitting: Family

## 2023-03-27 VITALS — BP 130/82 | HR 81 | Temp 98.2°F | Ht 66.0 in | Wt 161.6 lb

## 2023-03-27 DIAGNOSIS — N898 Other specified noninflammatory disorders of vagina: Secondary | ICD-10-CM

## 2023-03-27 DIAGNOSIS — R300 Dysuria: Secondary | ICD-10-CM | POA: Diagnosis not present

## 2023-03-27 LAB — POCT URINALYSIS DIPSTICK
Bilirubin, UA: NEGATIVE
Blood, UA: NEGATIVE
Glucose, UA: NEGATIVE
Ketones, UA: NEGATIVE
Leukocytes, UA: NEGATIVE
Nitrite, UA: NEGATIVE
Protein, UA: NEGATIVE
Spec Grav, UA: 1.015 (ref 1.010–1.025)
Urobilinogen, UA: 0.2 E.U./dL
pH, UA: 6 (ref 5.0–8.0)

## 2023-03-27 MED ORDER — FLUCONAZOLE 150 MG PO TABS: ORAL_TABLET | ORAL | 0 refills | Status: DC

## 2023-03-27 NOTE — Progress Notes (Signed)
Patient ID: Taylor Bailey, female    DOB: 29-Apr-1946, 77 y.o.   MRN: [id number]  Chief Complaint  Patient presents with   Vaginal Pain    Pt c/o vaginal pain/irritation and Dysuria. Present for 3 weeks. Seen In UC, U/A was clear. Has tried nitrofurantoin and phenazopyridine which she stopped after Culture was clear.     HPI:      Vaginal irritation:  pt reports having vaginal itching, burning when she urinates, irritation after wiping. Denies any vaginal discharge. She is outside a lot and sweats often, normally tries to change her underwear, did try OTC vaginal cream which didn't work. Then went to [facility] and thought she had UTI but culture was negative so she stopped medication.     Assessment & Plan:  1. Vaginal itching- sending Diflucan, advised on use & SE. Can also use OTC store brand antifungal cream & powder, advsied on use. Apply to dry skin. Continue to try and avoid moisture sitting on your skin as much as you can.  - fluconazole (DIFLUCAN) 150 MG tablet; Take 1 pill today and the 2nd pill in 3 days.  Dispense: 2 tablet; Refill: 0  2. Dysuria UA neg.  - POCT Urinalysis Dipstick   Subjective:    Outpatient Medications Prior to Visit  Medication Sig Dispense Refill   Cholecalciferol (VITAMIN D) 50 MCG (2000 UT) CAPS Take 2,000 Units by mouth daily. Gummie     No facility-administered medications prior to visit.   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   Chicken pox    Complication of anesthesia 2015   Colonoscopy   Osteoporosis 10/2017   T score -2.9   Vitamin D deficiency    takes 2000 units each day   Past Surgical History:  Procedure Laterality Date   basal cell carcinoma removal  07/26/2021   of nose   BREAST BIOPSY Left    BENIGN   BREAST EXCISIONAL BIOPSY Left    BRONCHIAL NEEDLE ASPIRATION BIOPSY  08/28/2020   Procedure:  BRONCHIAL NEEDLE ASPIRATION BIOPSIES;  Surgeon: Josephine Igo, DO;  Location: [facility];  Service: Pulmonary;;   CATARACT EXTRACTION, BILATERAL  2018   COLONOSCOPY     INTERCOSTAL NERVE BLOCK Left 10/01/2020   Procedure: INTERCOSTAL NERVE BLOCK;  Surgeon: Loreli Slot, MD;  [facility];  Service: Thoracic;  Laterality: Left;   IR THORACENTESIS ASP PLEURAL SPACE W/IMG GUIDE  10/30/2020   NODE DISSECTION Left 10/01/2020   Procedure: NODE DISSECTION;  Surgeon: Loreli Slot, MD;  Location: [facility] OR;  Service: Thoracic;  Laterality: Left;   TUBAL LIGATION     VIDEO BRONCHOSCOPY WITH ENDOBRONCHIAL ULTRASOUND Bilateral 08/28/2020   Procedure: VIDEO BRONCHOSCOPY WITH ENDOBRONCHIAL ULTRASOUND;  Surgeon: Josephine Igo, DO;  Location: [facility];  Service: Pulmonary;  Laterality: Bilateral;   WISDOM TOOTH EXTRACTION     Allergies  Allergen Reactions   Fluoride Preparations     FLU VACCINE   Influenza Vaccines     Hives, angioedema- monitored in office on benadryl (received at [employer])    Prednisone     Bad shakes/hyper after 2 days. Not sure about injections       Objective:    Physical Exam Vitals and nursing note reviewed.  Constitutional:      Appearance: Normal appearance.  Cardiovascular:  Rate and Rhythm: Normal rate and regular rhythm.  Pulmonary:     Effort: Pulmonary effort is normal.     Breath sounds: Normal breath sounds.  Musculoskeletal:        General: Normal range of motion.  Skin:    General: Skin is warm and dry.  Neurological:     Mental Status: She is alert.  Psychiatric:        Mood and Affect: Mood normal.        Behavior: Behavior normal.    BP 130/82   Pulse 81   Temp 98.2 F (36.8 C) (Temporal)   Ht 5\' 6"  (1.676 m)   Wt 161 lb 9.6 oz (73.3 kg)   SpO2 98%   BMI 26.08 kg/m  Wt Readings from Last 3 Encounters:  03/27/23 161 lb 9.6 oz (73.3 kg)  11/09/22 161 lb 6 oz (73.2 kg)  08/26/22 162 lb (73.5 kg)       Dulce Sellar, NP

## 2023-05-23 ENCOUNTER — Ambulatory Visit: Payer: Medicare Other | Admitting: Obstetrics and Gynecology

## 2023-08-29 ENCOUNTER — Encounter: Payer: Self-pay | Admitting: Family Medicine

## 2023-08-29 ENCOUNTER — Ambulatory Visit (INDEPENDENT_AMBULATORY_CARE_PROVIDER_SITE_OTHER): Payer: Medicare Other | Admitting: Family Medicine

## 2023-08-29 VITALS — BP 124/80 | HR 81 | Temp 97.0°F | Wt 163.6 lb

## 2023-08-29 DIAGNOSIS — E785 Hyperlipidemia, unspecified: Secondary | ICD-10-CM | POA: Diagnosis not present

## 2023-08-29 DIAGNOSIS — E559 Vitamin D deficiency, unspecified: Secondary | ICD-10-CM | POA: Diagnosis not present

## 2023-08-29 DIAGNOSIS — E663 Overweight: Secondary | ICD-10-CM

## 2023-08-29 DIAGNOSIS — I700 Atherosclerosis of aorta: Secondary | ICD-10-CM | POA: Diagnosis not present

## 2023-08-29 DIAGNOSIS — Z131 Encounter for screening for diabetes mellitus: Secondary | ICD-10-CM

## 2023-08-29 MED ORDER — LORAZEPAM 0.5 MG PO TABS: 0.5000 mg | ORAL_TABLET | Freq: Two times a day (BID) | ORAL | 1 refills | Status: DC | PRN

## 2023-08-29 NOTE — Progress Notes (Signed)
Phone [phone]   Subjective:  Patient presents today for their annual visit-traditional Medicare only so no physical.  Chief complaint-noted.   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    Priority: High   Aortic atherosclerosis (HCC) 08/30/2021    Priority: Medium    Hyperlipidemia, unspecified 04/02/2019    Priority: Medium    Osteoporosis 10/2017    Priority: Medium    S/P lobectomy of lung 10/01/2020    Priority: Low   Vitamin D deficiency     Priority: Low   Past Surgical History:  Procedure Laterality Date   basal cell carcinoma removal  07/26/2021   of nose   BREAST BIOPSY Left    BENIGN   BREAST EXCISIONAL BIOPSY Left    BRONCHIAL NEEDLE ASPIRATION BIOPSY  08/28/2020   Procedure: BRONCHIAL NEEDLE ASPIRATION BIOPSIES;  Surgeon: Josephine Igo, DO;  Location: [facility];  Service: Pulmonary;;   CATARACT EXTRACTION, BILATERAL  2018   COLONOSCOPY     INTERCOSTAL NERVE BLOCK Left 10/01/2020   Procedure: INTERCOSTAL NERVE BLOCK;  Surgeon: Loreli Slot, MD;  Location: [facility] OR;  Service: Thoracic;  Laterality: Left;   IR THORACENTESIS ASP PLEURAL SPACE W/IMG GUIDE  10/30/2020   NODE DISSECTION Left 10/01/2020   Procedure: NODE DISSECTION;  Surgeon: Loreli Slot, MD;  Location: [facility] OR;  Service: Thoracic;  Laterality: Left;   TUBAL LIGATION     VIDEO BRONCHOSCOPY WITH ENDOBRONCHIAL ULTRASOUND Bilateral  08/28/2020   Procedure: VIDEO BRONCHOSCOPY WITH ENDOBRONCHIAL ULTRASOUND;  Surgeon: Josephine Igo, DO;  Location: [facility];  Service: Pulmonary;  Laterality: Bilateral;   WISDOM TOOTH EXTRACTION      Family History  Problem Relation Age of Onset   Breast cancer Mother        lived to 44. relatives have lived in 75s and 90s   Diabetes Maternal Grandmother    Other Father        black lung- lived to 58   Healthy Sister    Colon cancer Neg Hx    Pancreatic cancer Neg Hx    Stomach cancer Neg Hx     Medications- reviewed and updated Current Outpatient Medications  Medication Sig Dispense Refill   Cholecalciferol (VITAMIN D) 50 MCG (2000 UT) CAPS Take 2,000 Units by mouth daily. Gummie     No current facility-administered medications for this visit.    Allergies-reviewed and updated Allergies  Allergen Reactions   Fluoride Preparations     FLU VACCINE   Influenza Vaccines     Hives, angioedema- monitored in office on benadryl (received at [facility])    Prednisone     Bad shakes/hyper after 2 days. Not sure about injections     Social History   Social History Narrative   Widowed in 2013- multiple medical conditions. 2 children- son married with 2 kids (boy 82, daughter 6)  and daughter in commercial real estate (age 41) 31.       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 Oropharynx normal Neck: no thyromegaly CV: RRR no murmurs rubs or gallops, some upper airway wheeze Lungs: CTAB no crackles, wheeze,  rhonchi Abdomen: soft/nontender/nondistended/normal bowel sounds. No rebound or guarding.  Ext: no edema Skin: warm, dry Neuro: grossly normal, moves all extremities, PERRLA   Assessment and Plan   # Health maintenance -Prior severe flu shot reaction -Due to reaction to flu shot prefers to hold off on Prevnar, COVID, RSV, Shingrix vaccination and avoid going out or wearing KN95 -She has had Tdap with [facility]-trying to get [place] the date  (thinks she has 2 more years) -Receives yearly mammograms  -Follows with [facility] now-past age based screening requirements for cervical cancer -Colonoscopy 11/22/2013 with 10-year repeat recommended- she will call in march  -weight within 1 pounds of last year -alcohol only once a week for most part -not dating  # Anxiety/panic attacks S: Patient reports has had some increasing anxiety since Thanksgiving-her family had noted -had anxiety attacks years and years ago around age 38 or 72 - was treated with medication in the past but eventually able to come off medications by using meditation -still doing meditation but not as effective  She describes feeling short of breath when anxiety goes up and this is more challenging as she has lobectomy. Frequency twice a week. Any stress will triger it -still able to walk and exercise and has no issues with shortness of breath with that -starts to stress more as gets closer to annual scans in February as well -being outdoors helps but harder in winter -think going back to gym may help- sees neighbors short term basis and sees family but feels social isolation contribution - wants to hold off on therapy- tried in past but not as helpful A/P: anxiety with panic attacks that have recurred recently up to twice a week- discussed option of daily medication(s) but shed like to start with as needed and see how things go- if increases more than twice a week we can sit back down to discuss other options.   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 Still mows yard -most recent scan 11/07/2022 and planned in february A/P:  no obvious recurrence- continue annual scans which are already ordered   #hyperlipidemia # Aortic atherosclerosis but no coronary artery calcium noted on chest CT S: Medication:None -She is aware that aortic atherosclerosis is an independent risk factor cardiovascular disease Lab Results  Component Value Date   CHOL 224 (H) 09/08/2022   HDL 77.20 09/08/2022   LDLCALC 130 (H) 09/08/2022   TRIG 82.0 09/08/2022   CHOLHDL  3 09/08/2022   A/P: update lipids but she prefers to hold off on medication unless has advancement such as coronary artery calcifications -feels could make dietary changes- has already cut down on beef/bacon and mentioned red yeast rice   # GERD-Pepcid as needed over-the-counter - did have flare a few weeks ago and needed for 5 days  # Osteoporosis S: Last DEXA: 04/12/2022 with Dr. Seymour Bars (now gone)-stable to improved so opted out of medications   Medication (bisphosphonate or prolia): None  Calcium: 1200mg  (through diet ok) recommended -yes Vitamin D: 1000 units a day recommended-yes actually 2000 units A/P: osteoporosis hopefully stable- does great job with steps and calcium and vitmain D- consider update next year   #Vitamin D deficiency S: Medication: 2000 units A/P: hopefully stable- update vitamin D today. Continue current meds for now    # Thyroid nodule-noted on chest CT and follow-up thyroid ultrasound on 11/11/2022 showed 1.5 cm right sided cystic and left-sided spongiform nodules that do not meet criteria for biopsy or follow-up   Recommended follow up: Return in about 6 months (around 02/27/2024) for followup  or sooner if needed.Schedule b4 you leave. Future Appointments  Date Time Provider Department Center  11/06/2023 10:00 AM [facility] 2 [facility] [place]  11/08/2023 11:00 AM [facility] None  11/13/2023  3:30 PM Si Gaul, MD [facility] None   Lab/Order associations: Not fasting   ICD-10-CM   1. Hyperlipidemia, unspecified hyperlipidemia type  E78.5     2. Aortic atherosclerosis (HCC)  I70.0     3. Vitamin D deficiency  E55.9     4. Screening for diabetes mellitus  Z13.1     5. Overweight  E66.3       No orders of the defined types were placed in this encounter.   Return precautions advised.  Tana Conch, MD

## 2023-08-29 NOTE — Patient Instructions (Addendum)
Let us know the date of your TDap if you can find it  Call in march to schedule colonoscopy [facility] contact Please call to schedule visit and/or procedure Address: [address] [place], [place], [place] [address] Phone: [phone]   anxiety with panic attacks that have recurred recently up to twice a week- discussed option of daily medication(s) but shed like to start with as needed and see how things go- if increases more than twice a week we can sit back down to discuss other options.   Please stop by lab before you go If you have mychart- we will send your results within 3 business days of [place] receiving them.  If you do not have mychart- we will call you about results within 5 business days of [place] receiving them.  *please also note that you will see labs on mychart as soon as they post. I will later go in and write notes on them- will say "notes from Dr. Durene Cal"   Recommended follow up: Return in about 6 months (around 02/27/2024) for followup or sooner if needed.Schedule b4 you leave.  Sooner thann usal with anxiety issues

## 2023-09-14 ENCOUNTER — Telehealth: Payer: Self-pay | Admitting: Family Medicine

## 2023-09-14 ENCOUNTER — Other Ambulatory Visit: Payer: Self-pay

## 2023-09-14 DIAGNOSIS — E663 Overweight: Secondary | ICD-10-CM

## 2023-09-14 DIAGNOSIS — E559 Vitamin D deficiency, unspecified: Secondary | ICD-10-CM

## 2023-09-14 DIAGNOSIS — E785 Hyperlipidemia, unspecified: Secondary | ICD-10-CM

## 2023-09-14 DIAGNOSIS — Z131 Encounter for screening for diabetes mellitus: Secondary | ICD-10-CM

## 2023-09-14 NOTE — Telephone Encounter (Signed)
 Attempted to reach pt to schedule for a lab visit .

## 2023-11-06 ENCOUNTER — Inpatient Hospital Stay: Payer: Medicare Other | Attending: Internal Medicine

## 2023-11-06 ENCOUNTER — Ambulatory Visit (HOSPITAL_COMMUNITY)
Admission: RE | Admit: 2023-11-06 | Discharge: 2023-11-06 | Disposition: A | Discharge status: Home / Self Care | Payer: Medicare Other | Source: Ambulatory Visit | Attending: Internal Medicine | Admitting: Internal Medicine

## 2023-11-06 DIAGNOSIS — C3490 Malignant neoplasm of unspecified part of unspecified bronchus or lung: Secondary | ICD-10-CM | POA: Insufficient documentation

## 2023-11-06 DIAGNOSIS — C7A1 Malignant poorly differentiated neuroendocrine tumors: Secondary | ICD-10-CM | POA: Diagnosis present

## 2023-11-06 LAB — CBC WITH DIFFERENTIAL (CANCER CENTER ONLY)
Abs Immature Granulocytes: 0.01 10*3/uL (ref 0.00–0.07)
Basophils Absolute: 0.1 10*3/uL (ref 0.0–0.1)
Basophils Relative: 1 %
Eosinophils Absolute: 0.1 10*3/uL (ref 0.0–0.5)
Eosinophils Relative: 1 %
HCT: 38.6 % (ref 36.0–46.0)
Hemoglobin: 12.6 g/dL (ref 12.0–15.0)
Immature Granulocytes: 0 %
Lymphocytes Relative: 34 %
Lymphs Abs: 1.7 10*3/uL (ref 0.7–4.0)
MCH: 28.3 pg (ref 26.0–34.0)
MCHC: 32.6 g/dL (ref 30.0–36.0)
MCV: 86.5 fL (ref 80.0–100.0)
Monocytes Absolute: 0.5 10*3/uL (ref 0.1–1.0)
Monocytes Relative: 10 %
Neutro Abs: 2.6 10*3/uL (ref 1.7–7.7)
Neutrophils Relative %: 54 %
Platelet Count: 222 10*3/uL (ref 150–400)
RBC: 4.46 MIL/uL (ref 3.87–5.11)
RDW: 14 % (ref 11.5–15.5)
WBC Count: 4.9 10*3/uL (ref 4.0–10.5)
nRBC: 0 % (ref 0.0–0.2)

## 2023-11-06 LAB — CMP (CANCER CENTER ONLY)
ALT: 12 U/L (ref 0–44)
AST: 17 U/L (ref 15–41)
Albumin: 4.2 g/dL (ref 3.5–5.0)
Alkaline Phosphatase: 62 U/L (ref 38–126)
Anion gap: 5 (ref 5–15)
BUN: 9 mg/dL (ref 8–23)
CO2: 28 mmol/L (ref 22–32)
Calcium: 9.3 mg/dL (ref 8.9–10.3)
Chloride: 108 mmol/L (ref 98–111)
Creatinine: 0.68 mg/dL (ref 0.44–1.00)
GFR, Estimated: >60 mL/min (ref >60)
Glucose, Bld: 94 mg/dL (ref 70–99)
Potassium: 4 mmol/L (ref 3.5–5.1)
Sodium: 141 mmol/L (ref 135–145)
Total Bilirubin: 0.5 mg/dL (ref 0.0–1.2)
Total Protein: 6.9 g/dL (ref 6.5–8.1)

## 2023-11-06 MED ORDER — IOHEXOL 300 MG/ML  SOLN
100.0000 mL | Freq: Once | INTRAMUSCULAR | Status: AC | PRN
  Administered 2023-11-06: 100 mL via INTRAVENOUS

## 2023-11-08 ENCOUNTER — Other Ambulatory Visit: Payer: Medicare Other

## 2023-11-13 ENCOUNTER — Inpatient Hospital Stay: Payer: Medicare Other | Attending: Internal Medicine | Admitting: Internal Medicine

## 2023-11-13 VITALS — BP 143/85 | HR 73 | Temp 98.4°F | Resp 17 | Wt 164.8 lb

## 2023-11-13 DIAGNOSIS — F419 Anxiety disorder, unspecified: Secondary | ICD-10-CM | POA: Diagnosis not present

## 2023-11-13 DIAGNOSIS — E559 Vitamin D deficiency, unspecified: Secondary | ICD-10-CM | POA: Diagnosis not present

## 2023-11-13 DIAGNOSIS — M810 Age-related osteoporosis without current pathological fracture: Secondary | ICD-10-CM | POA: Insufficient documentation

## 2023-11-13 DIAGNOSIS — D3A090 Benign carcinoid tumor of the bronchus and lung: Secondary | ICD-10-CM | POA: Diagnosis not present

## 2023-11-13 DIAGNOSIS — I7090 Unspecified atherosclerosis: Secondary | ICD-10-CM | POA: Insufficient documentation

## 2023-11-13 DIAGNOSIS — E042 Nontoxic multinodular goiter: Secondary | ICD-10-CM | POA: Diagnosis not present

## 2023-11-13 DIAGNOSIS — C7A1 Malignant poorly differentiated neuroendocrine tumors: Secondary | ICD-10-CM | POA: Insufficient documentation

## 2023-11-13 DIAGNOSIS — I10 Essential (primary) hypertension: Secondary | ICD-10-CM | POA: Diagnosis not present

## 2023-11-13 DIAGNOSIS — K449 Diaphragmatic hernia without obstruction or gangrene: Secondary | ICD-10-CM | POA: Diagnosis not present

## 2023-11-13 NOTE — Progress Notes (Signed)
 [facility] Telephone:([phone]) [phone]   Fax:([phone]) [phone]  OFFICE PROGRESS NOTE  Shelva Majestic, MD 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 Dorris Fetch on October 01, 2020.  CURRENT THERAPY: Observation  INTERVAL HISTORY: Taylor Bailey 78 y.o. female returns to the clinic today for annual follow-up visit.Discussed the use of AI scribe software for clinical note transcription with the patient, who gave verbal consent to proceed.  History of Present Illness   Taylor Bailey is a 78 year old female with a history of stage 2A carcinoid tumor who presents for routine follow-up.  She has a history of stage 2A carcinoid tumor diagnosed in 2022, for which she underwent a left upper lobectomy. She has been under observation with annual scans since then. Her last scan a year ago revealed stability with no new growth or spread of the tumor.  A left thyroid nodule was noted during her last scan. It was not biopsied as it did not meet the criteria. She continues to be monitored for this nodule.  She notes discomfort at the site of her surgical incision, which is more noticeable in cold weather. Despite this, she remains physically active, walking about twenty miles a week. No new chest pain or breathing issues.  She experiences anxiety, particularly during the holidays, which she manages with meditation and deep breathing. She had a severe episode in December and was prescribed lorazepam 0.5 mg to be taken as needed. She has used five out of the twenty pills prescribed.        MEDICAL HISTORY: Past Medical History:  Diagnosis Date   Anxiety    mediatation- last time as a teenager   Cancer (HCC) 2020   basal cell  and squamos cell removed from left leg   Chicken pox    Complication of anesthesia 2015   Colonoscopy   Osteoporosis 10/2017   T score -2.9   Vitamin D deficiency    takes 2000 units each day    ALLERGIES:  is allergic to fluoride preparations, influenza vaccines, and prednisone.  MEDICATIONS:  Current Outpatient Medications  Medication Sig Dispense Refill   Cholecalciferol (VITAMIN D) 50 MCG (2000 UT) CAPS Take 2,000 Units by mouth daily. Gummie     LORazepam (ATIVAN) 0.5 MG tablet Take 1 tablet (0.5 mg total) by mouth 2 (two) times daily as needed for anxiety (do not drive for 8 hours after taking or drink alcohol within 8 hours of use). 20 tablet 1   No current facility-administered medications for this visit.    SURGICAL HISTORY:  Past Surgical History:  Procedure Laterality Date   basal cell carcinoma removal  07/26/2021   of nose   BREAST BIOPSY Left    BENIGN   BREAST EXCISIONAL BIOPSY Left    BRONCHIAL NEEDLE ASPIRATION BIOPSY  08/28/2020   Procedure: BRONCHIAL NEEDLE ASPIRATION BIOPSIES;  Surgeon: Josephine Igo, DO;  Location: [facility];  Service: Pulmonary;;   CATARACT EXTRACTION, BILATERAL  2018   COLONOSCOPY     INTERCOSTAL NERVE BLOCK Left 10/01/2020   Procedure: INTERCOSTAL NERVE BLOCK;  Surgeon: Loreli Slot, MD;  Location: [facility];  Service: Thoracic;  Laterality: Left;   IR THORACENTESIS ASP PLEURAL SPACE W/IMG GUIDE  10/30/2020  NODE DISSECTION Left 10/01/2020   Procedure: NODE DISSECTION;  Surgeon: Loreli Slot, MD;  Location: [facility] OR;  Service: Thoracic;  Laterality: Left;   TUBAL LIGATION     VIDEO BRONCHOSCOPY WITH ENDOBRONCHIAL ULTRASOUND Bilateral 08/28/2020   Procedure: VIDEO BRONCHOSCOPY WITH ENDOBRONCHIAL ULTRASOUND;  Surgeon: Josephine Igo, DO;  Location: [facility];  Service: Pulmonary;  Laterality: Bilateral;   WISDOM TOOTH EXTRACTION      REVIEW OF SYSTEMS:  A comprehensive review of systems was negative.    PHYSICAL EXAMINATION: General appearance: alert, cooperative, and no distress Head: Normocephalic, without obvious abnormality, atraumatic Neck: no adenopathy, no JVD, supple, symmetrical, trachea midline, and thyroid not enlarged, symmetric, no tenderness/mass/nodules Lymph nodes: Cervical, supraclavicular, and axillary nodes normal. Resp: clear to auscultation bilaterally Back: symmetric, no curvature. ROM normal. No CVA tenderness. Cardio: regular rate and rhythm, S1, S2 normal, no murmur, click, rub or gallop GI: soft, non-tender; bowel sounds normal; no masses,  no organomegaly Extremities: extremities normal, atraumatic, no cyanosis or edema  ECOG PERFORMANCE STATUS: 0 - Asymptomatic  Blood pressure (!) 143/85, pulse 73, temperature 98.4 F (36.9 C), temperature source Temporal, resp. rate 17, weight 164 lb 12.8 oz (74.8 kg), SpO2 100%.  LABORATORY DATA: Lab Results  Component Value Date   WBC 4.9 11/06/2023   HGB 12.6 11/06/2023   HCT 38.6 11/06/2023   MCV 86.5 11/06/2023   PLT 222 11/06/2023      Chemistry      Component Value Date/Time   NA 141 11/06/2023 0846   K 4.0 11/06/2023 0846   CL 108 11/06/2023 0846   CO2 28 11/06/2023 0846   BUN 9 11/06/2023 0846   CREATININE 0.68 11/06/2023 0846      Component Value Date/Time   CALCIUM 9.3 11/06/2023 0846   ALKPHOS 62 11/06/2023 0846   AST 17 11/06/2023 0846   ALT 12 11/06/2023 0846   BILITOT 0.5 11/06/2023 0846       RADIOGRAPHIC STUDIES: CT Chest W Contrast Result Date: 11/11/2023 CLINICAL DATA:  Restaging non-small cell lung cancer. * Tracking Code: BO * EXAM: CT CHEST WITH CONTRAST TECHNIQUE: Multidetector CT imaging of the chest was performed during intravenous contrast administration. RADIATION DOSE REDUCTION: This exam was performed according to the departmental dose-optimization program which includes automated exposure control, adjustment of the mA and/or kV according to patient size and/or use of  iterative reconstruction technique. CONTRAST:  OMNIPAQUE IOHEXOL 300 MG/ML  SOLN COMPARISON:  Multiple prior imaging studies. The most recent chest CT is 11/07/2022. FINDINGS: Cardiovascular: The heart is normal in size. No pericardial effusion. The aorta is normal in caliber. No dissection. Stable atherosclerotic calcifications. Mediastinum/Nodes: No mediastinal or hilar mass or lymphadenopathy. Stable moderate-sized hiatal hernia. 9 mm right thyroid nodule. This has been evaluated on previous imaging. (ref: J Am Coll Radiol. 2015 Feb;12(2): 143-50). Lungs/Pleura: Stable surgical changes from a left upper lobe lobectomy. Stable 5 mm right lower lobe ground-glass nodule (image 113/8). No new pulmonary lesions or pulmonary nodules. Stable basilar scarring changes. No acute pulmonary process. No pleural effusions or pleural nodules. Upper Abdomen: No significant upper abdominal findings. Stable vascular calcifications. No hepatic or adrenal gland lesions. No upper abdominal adenopathy. Musculoskeletal: No breast masses, supraclavicular or axillary adenopathy. The bony thorax is intact. No worrisome lytic or sclerotic bone lesions. IMPRESSION: 1. Stable surgical changes from a left upper lobe lobectomy. 2. Stable 5 mm right lower lobe ground-glass nodule. Recommend follow-up CT scan in 12 to 24 months. 3. No findings  for recurrent or metastatic disease. 4. Stable moderate-sized hiatal hernia. Electronically Signed   By: Rudie Meyer M.D.   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 Dorris Fetch on October 01, 2020. The patient is currently on observation and she is feeling fine with no concerning complaints. She had repeat CT scan of the chest  performed recently.  I personally and independently reviewed the scan and discussed the result with the patient today.  Her scan showed no concerning findings for disease recurrence or metastasis.     Stage 2A Carcinoid Tumor (Post Left Upper Lobectomy) Diagnosed with a stage 2A carcinoid tumor in 2022, she underwent a left upper lobectomy. Annual scans show no new growth or metastasis. - Continue annual scans to monitor for changes or recurrence. - Schedule follow-up in one year with repeat scan.  Post-Surgical Pain Experiences occasional discomfort at the incision site, particularly in colder weather, attributed to aging. - Reassure that occasional post-surgical discomfort is common. - Advise continuation of regular physical activity as tolerated.  Left Thyroid Nodule A left thyroid nodule was identified a year ago. Ultrasound showed it did not meet biopsy criteria and remains unchanged. - Monitor with periodic ultrasounds as needed.  Hypertension Blood pressure is slightly elevated, a recurrent issue during clinic visits. - Monitor blood pressure regularly. - Advise her to report significant changes to her primary care physician.  Anxiety Experiences anxiety, especially during holidays, managed with meditation and deep breathing. Had a severe episode in December, prescribed lorazepam 0.5 mg as needed by her primary care physician. - Continue lorazepam 0.5 mg as needed. - Encourage continued use of meditation and deep breathing techniques.  General Health Maintenance Active, walking about twenty miles a week. - Perform colonoscopy this year.   The patient was advised to call immediately if she has any other concerning symptoms in the interval. The patient voices understanding of current disease status and treatment options and is in agreement with the current care plan.  All questions were answered. The patient knows to call the clinic with any problems, questions or concerns. We  can certainly see the patient much sooner if necessary.  Disclaimer: This note was dictated with voice recognition software. Similar sounding words can inadvertently be transcribed and may not be corrected upon review.

## 2023-12-11 ENCOUNTER — Other Ambulatory Visit: Payer: Self-pay | Admitting: Family Medicine

## 2023-12-11 DIAGNOSIS — Z1231 Encounter for screening mammogram for malignant neoplasm of breast: Secondary | ICD-10-CM

## 2023-12-22 ENCOUNTER — Ambulatory Visit: Admitting: Podiatry

## 2023-12-22 ENCOUNTER — Ambulatory Visit (INDEPENDENT_AMBULATORY_CARE_PROVIDER_SITE_OTHER)

## 2023-12-22 DIAGNOSIS — M778 Other enthesopathies, not elsewhere classified: Secondary | ICD-10-CM

## 2023-12-22 DIAGNOSIS — M722 Plantar fascial fibromatosis: Secondary | ICD-10-CM | POA: Diagnosis not present

## 2023-12-22 NOTE — Progress Notes (Signed)
 Chief Complaint  Patient presents with   Foot Pain    Right heel pain. She is describing PF pain, where it does not hurt while at rest but going from rest to standing and walking. Feels like she's walking on glass. Not diabetic and no anti coag.    HPI: 78 y.o. female presenting today with c/o pain in the bottom of the right heel.  Pain is rated as 8/10.  Patient has not had walker stating that she walks at least 20 miles a week for exercise.  She has never had pain in the bottom of the heel before.  She recently had some skin cancer removed from the anterior left leg.  There is a dressing over this area.  Patient lives alone and is quite independent.  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   Chicken pox    Complication of anesthesia 2015   Colonoscopy   Osteoporosis 10/2017   T score -2.9   Vitamin D deficiency    takes 2000 units each day    Past Surgical History:  Procedure Laterality Date   basal cell carcinoma removal  07/26/2021   of nose   BREAST BIOPSY Left    BENIGN   BREAST EXCISIONAL BIOPSY Left    BRONCHIAL NEEDLE ASPIRATION BIOPSY  08/28/2020   Procedure: BRONCHIAL NEEDLE ASPIRATION BIOPSIES;  Surgeon: Prudy Brownie, DO;  Location: [facility];  Service: Pulmonary;;   CATARACT EXTRACTION, BILATERAL  2018   COLONOSCOPY     INTERCOSTAL NERVE BLOCK Left 10/01/2020   Procedure: INTERCOSTAL NERVE BLOCK;  Surgeon: Zelphia Higashi, MD;  Location: [facility] OR;  Service: Thoracic;  Laterality: Left;   IR THORACENTESIS ASP PLEURAL SPACE W/IMG GUIDE  10/30/2020   NODE DISSECTION Left 10/01/2020   Procedure: NODE DISSECTION;  Surgeon: Zelphia Higashi, MD;  Location: [facility] OR;  Service: Thoracic;  Laterality: Left;   TUBAL LIGATION     VIDEO BRONCHOSCOPY WITH ENDOBRONCHIAL ULTRASOUND Bilateral 08/28/2020   Procedure: VIDEO BRONCHOSCOPY WITH  ENDOBRONCHIAL ULTRASOUND;  Surgeon: Prudy Brownie, DO;  Location: [facility];  Service: Pulmonary;  Laterality: Bilateral;   WISDOM TOOTH EXTRACTION      Allergies  Allergen Reactions   Fluoride Preparations     FLU VACCINE   Influenza Vaccines     Hives, angioedema- monitored in office on benadryl (received at [facility])    Prednisone     Bad shakes/hyper after 2 days. Not sure about injections      Physical Exam: General: The patient is alert and oriented x3 in no acute distress.  Dermatology:  No ecchymosis, erythema, or edema bilateral.  No open lesions.    Vascular: Palpable pedal pulses bilaterally. Capillary refill within normal limits.  No appreciable edema.    Neurological: Light touch sensation intact bilateral.  MMT 5/5 to lower extremity bilateral. Negative Tinel's sign with percussion of the posterior tibial nerve on the affected extremity.    Musculoskeletal Exam:  There is pain on palpation of the plantarmedial & plantarcentral aspect of right heel.  No gaps or nodules within the plantar fascia.  Positive Windlass mechanism bilateral.  Antalgic gait noted with first few steps upon standing.  No pain on palpation of achilles tendon bilateral.  Ankle df less than 10 degrees with knee extended b/l.  Radiographic Exam (right foot, 3 weightbearing  views, 12/22/2023):  Normal osseous mineralization.  Joint space narrowing at the first MPJ and talonavicular joint.  Tiny posterior calcaneal spur noted.  No inferior calcaneal spur seen.  Slight anterior beaking of the calcaneus noted  Assessment/Plan of Care: 1. Capsulitis of right foot   2. Plantar fasciitis of right foot    -Reviewed etiology of plantar fasciitis with patient.  Discussed treatment options with patient today, including cortisone injection, NSAID course of treatment, stretching exercises, physical therapy, use of night splint, rest, icing the heel, arch supports/orthotics, and supportive shoe gear.    With  the patient's verbal consent, a corticosteroid injection was administered to the right heel, consisting of a mixture of 1% lidocaine plain, 0.5% Sensorcaine plain, and Kenalog-10 for a total of 1.5cc administered.  A Band-aid was applied. Pain level post-injection is 5/10.  Power steps were fitted and dispensed today to wear in her shoes for additional arch support.  She was given stretching exercises to start performing daily.  Patient declined a night splint stating that she already does not sleep well through the night.  Declined NSAIDs as well  Return in about 4 weeks (around 01/19/2024) for f/u plantar fasciitis.   Joe Murders, DPM, FACFAS [facility]     [address] [place], [place] [address]                Office [phone]  Fax [phone]

## 2023-12-22 NOTE — Patient Instructions (Signed)

## 2024-01-05 ENCOUNTER — Encounter: Payer: Self-pay | Admitting: Physician Assistant

## 2024-01-17 ENCOUNTER — Ambulatory Visit: Admitting: Podiatry

## 2024-01-23 ENCOUNTER — Ambulatory Visit
Admission: RE | Admit: 2024-01-23 | Discharge: 2024-01-23 | Disposition: A | Discharge status: Home / Self Care | Source: Ambulatory Visit | Attending: Family Medicine | Admitting: Family Medicine

## 2024-01-23 DIAGNOSIS — Z1231 Encounter for screening mammogram for malignant neoplasm of breast: Secondary | ICD-10-CM

## 2024-02-26 ENCOUNTER — Ambulatory Visit (INDEPENDENT_AMBULATORY_CARE_PROVIDER_SITE_OTHER): Admitting: Physician Assistant

## 2024-02-26 ENCOUNTER — Encounter: Payer: Self-pay | Admitting: Physician Assistant

## 2024-02-26 VITALS — BP 136/78 | HR 78 | Ht 65.0 in | Wt 160.0 lb

## 2024-02-26 DIAGNOSIS — Z902 Acquired absence of lung [part of]: Secondary | ICD-10-CM

## 2024-02-26 DIAGNOSIS — Z1211 Encounter for screening for malignant neoplasm of colon: Secondary | ICD-10-CM | POA: Diagnosis not present

## 2024-02-26 NOTE — Progress Notes (Signed)
 Chief Complaint: Discuss colonoscopy  HPI:    Taylor Bailey is a  78 y/o female with a past medical history as listed below including a complication of anesthesia during colonoscopy, osteoporosis and multiple others, known to Dr. Willy Harvest, who was referred to me by Almira Jaeger, MD for consideration of a colonoscopy.    07/25/2003 colonoscopy with internal and external hemorrhoids and otherwise normal.  Repeat recommended 10 years.    11/22/2013 colonoscopy for screening was normal small external hemorrhoids, excellent prep.  Repeat recommended 10 years.    11/06/2023 CBC and CMP normal.    Today, patient presents to clinic and tells me that her primary care physician thought she should have another colonoscopy given her history of lung cancer.  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  She has been doing well with recent follow-up with the cancer center in March.  She has never had polyps and has no acute GI complaints or concerns, no change in bowel habits, abdominal pain, weight loss or blood in her stool.    Briefly discussed complication from anesthesia and patient does not recall this.    Denies fever, chills, nausea, vomiting or symptoms that awaken her from sleep.  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   Chicken pox    Complication of anesthesia 2015   Colonoscopy   Lung cancer (HCC)    Left lower lobe removed   Osteoporosis 10/2017   T score -2.9   Vitamin D  deficiency    takes 2000 units each day    Past Surgical History:  Procedure Laterality Date   basal cell carcinoma removal  07/26/2021   of nose   BREAST BIOPSY Left    BENIGN   BREAST EXCISIONAL BIOPSY Left    BRONCHIAL NEEDLE ASPIRATION BIOPSY  08/28/2020   Procedure: BRONCHIAL NEEDLE ASPIRATION BIOPSIES;  Surgeon: Prudy Brownie, DO;   Location: [facility];  Service: Pulmonary;;   CATARACT EXTRACTION, BILATERAL  2018   COLONOSCOPY     INTERCOSTAL NERVE BLOCK Left 10/01/2020   Procedure: INTERCOSTAL NERVE BLOCK;  Surgeon: Zelphia Higashi, MD;  Location: [facility] OR;  Service: Thoracic;  Laterality: Left;   IR THORACENTESIS ASP PLEURAL SPACE W/IMG GUIDE  10/30/2020   NODE DISSECTION Left 10/01/2020   Procedure: NODE DISSECTION;  Surgeon: Zelphia Higashi, MD;  Location: [facility] OR;  Service: Thoracic;  Laterality: Left;   TUBAL LIGATION     VIDEO BRONCHOSCOPY WITH ENDOBRONCHIAL ULTRASOUND Bilateral 08/28/2020   Procedure: VIDEO BRONCHOSCOPY WITH ENDOBRONCHIAL ULTRASOUND;  Surgeon: Prudy Brownie, DO;  Location: [facility];  Service: Pulmonary;  Laterality: Bilateral;   WISDOM TOOTH EXTRACTION      Current Outpatient Medications  Medication Sig Dispense Refill   Cholecalciferol (VITAMIN D ) 50 MCG (2000 UT) CAPS Take 2,000 Units by mouth daily. Gummie     LORazepam  (ATIVAN ) 0.5 MG tablet Take 1 tablet (0.5 mg total) by mouth 2 (two) times daily as needed for anxiety (do not drive for 8 hours after taking or drink alcohol within 8 hours of use). 20 tablet 1   No current facility-administered medications for this visit.    Allergies as of 02/26/2024 - Review Complete 02/26/2024  Allergen Reaction Noted   Fluoride preparations  07/19/2017   Influenza vaccines  04/02/2019  Prednisone   04/02/2019    Family History  Problem Relation Age of Onset   Breast cancer Mother        lived to 10. relatives have lived in 54s and 90s   Diabetes Maternal Grandmother    Other Father        black lung- lived to 41   Healthy Sister    Colon cancer Neg Hx    Pancreatic cancer Neg Hx    Stomach cancer Neg Hx     Social History   Socioeconomic History   Marital status: Widowed    Spouse name: Not on file   Number of children: 2   Years of education: Not on file   Highest education level: Associate degree: academic  program  Occupational History   Not on file  Tobacco Use   Smoking status: Never   Smokeless tobacco: Never  Vaping Use   Vaping status: Never Used  Substance and Sexual Activity   Alcohol use: Yes    Alcohol/week: 1.0 standard drink of alcohol    Types: 1 Glasses of wine per week    Comment: occ glass of wine   Drug use: No   Sexual activity: Not Currently    Comment: 1st intercourse- 22, partners- 1   Other Topics Concern   Not on file  Social History Narrative   Widowed in 2013- multiple medical conditions. 2 children- son married with 2 kids (boy 41, daughter 55) and daughter in commercial real estate (age 47) 68.       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      Hobbies: gardening, multiple projects at home, enjoys readings, card games   Social Drivers of Health   Financial Resource Strain: Low Risk  (02/24/2024)   Overall Financial Resource Strain (CARDIA)    Difficulty of Paying Living Expenses: Not hard at all  Food Insecurity: No Food Insecurity (02/24/2024)   Hunger Vital Sign    Worried About Running Out of Food in the Last Year: Never true    Ran Out of Food in the Last Year: Never true  Transportation Needs: No Transportation Needs (02/24/2024)   PRAPARE - Administrator, Civil Service (Medical): No    Lack of Transportation (Non-Medical): No  Physical Activity: Sufficiently Active (02/24/2024)   Exercise Vital Sign    Days of Exercise per Week: 7 days    Minutes of Exercise per Session: 30 min  Stress: Stress Concern Present (02/24/2024)   [employer] of Occupational Health - Occupational Stress Questionnaire    Feeling of Stress: To some extent  Social Connections: Moderately Integrated (02/24/2024)   Social Connection and Isolation Panel    Frequency of Communication  with Friends and Family: More than three times a week    Frequency of Social Gatherings with Friends and Family: Twice a week    Attends Religious Services: More than 4 times per year    Active Member of [employer] or Organizations: Yes    Attends Banker Meetings: More than 4 times per year    Marital Status: Widowed  Intimate Partner Violence: Not At Risk (09/23/2021)   Humiliation, Afraid, Rape, and Kick questionnaire    Fear of Current or Ex-Partner: No    Emotionally Abused: No    Physically Abused: No  Sexually Abused: No    Review of Systems:    Constitutional: No weight loss, fever or chills Skin: No rash Cardiovascular: No chest pain  Respiratory: No SOB  Gastrointestinal: See HPI and otherwise negative Genitourinary: No dysuria Neurological: No headache, dizziness or syncope Musculoskeletal: No new muscle or joint pain Hematologic: No bleeding  Psychiatric: No history of depression or anxiety   Physical Exam:  Vital signs: BP 136/78 (BP Location: Right Arm, Patient Position: Sitting, Cuff Size: Normal)   Pulse 78   Ht 5' 5 (1.651 m)   Wt 160 lb (72.6 kg)   BMI 26.63 kg/m    Constitutional:   Pleasant elderly Caucasian female appears to be in NAD, Well developed, Well nourished, alert and cooperative Head:  Normocephalic and atraumatic. Eyes:   PEERL, EOMI. No icterus. Conjunctiva pink. Ears:  Normal auditory acuity. Neck:  Supple Throat: Oral cavity and pharynx without inflammation, swelling or lesion.  Respiratory: Respirations even and unlabored. Lungs clear to auscultation bilaterally.   No wheezes, crackles, or rhonchi.  Cardiovascular: Normal S1, S2. No MRG. Regular rate and rhythm. No peripheral edema, cyanosis or pallor.  Gastrointestinal:  Soft, nondistended, nontender. No rebound or guarding. Normal bowel sounds. No appreciable masses or hepatomegaly. Rectal:  Not performed.  Msk:  Symmetrical without gross deformities. Without edema, no  deformity or joint abnormality.  Neurologic:  Alert and  oriented x4;  grossly normal neurologically.  Skin:   Dry and intact without significant lesions or rashes. Psychiatric: Demonstrates good judgement and reason without abnormal affect or behaviors.  RELEVANT LABS AND IMAGING: CBC    Component Value Date/Time   WBC 4.9 11/06/2023 0846   WBC 6.2 09/08/2022 0856   RBC 4.46 11/06/2023 0846   HGB 12.6 11/06/2023 0846   HCT 38.6 11/06/2023 0846   PLT 222 11/06/2023 0846   MCV 86.5 11/06/2023 0846   MCH 28.3 11/06/2023 0846   MCHC 32.6 11/06/2023 0846   RDW 14.0 11/06/2023 0846   LYMPHSABS 1.7 11/06/2023 0846   MONOABS 0.5 11/06/2023 0846   EOSABS 0.1 11/06/2023 0846   BASOSABS 0.1 11/06/2023 0846    CMP     Component Value Date/Time   NA 141 11/06/2023 0846   K 4.0 11/06/2023 0846   CL 108 11/06/2023 0846   CO2 28 11/06/2023 0846   GLUCOSE 94 11/06/2023 0846   BUN 9 11/06/2023 0846   CREATININE 0.68 11/06/2023 0846   CALCIUM 9.3 11/06/2023 0846   PROT 6.9 11/06/2023 0846   ALBUMIN  4.2 11/06/2023 0846   AST 17 11/06/2023 0846   ALT 12 11/06/2023 0846   ALKPHOS 62 11/06/2023 0846   BILITOT 0.5 11/06/2023 0846   GFRNONAA >60 11/06/2023 0846    Assessment: 1.  Screening for colon cancer: Patient has had 2 prior colonoscopies both with no polyps the last in 2015 with repeat recommended in 10 years, patient is now 40 with an upper left lobectomy after lung cancer in 2022, she is doing well with no acute GI complaints or concerns  Plan: 1.  Discussed with patient that given that she has never had polyps I think that Cologuard would be a good start for her for colon cancer screening.  She wanted to do this given that she has always had trouble with the bowel prep for colonoscopies.  If this is positive then she understands that we will need to schedule her for a colonoscopy.  If negative this will count as her screening. 2.  Patient to follow  in clinic per recommendations  after we receive Cologuard results.  Reginal Capra, PA-C [facility] 02/26/2024, 1:39 PM  Cc: Almira Jaeger, MD

## 2024-02-26 NOTE — Addendum Note (Signed)
 Addended by: Orlene Bjork on: 02/26/2024 02:12 PM   Modules accepted: Orders

## 2024-02-26 NOTE — Patient Instructions (Signed)
 Your provider has ordered Cologuard testing as an option for colon cancer screening. This is performed by [facility] [employer] and may be out of network with your insurance. PRIOR to completing the test, it is YOUR responsibility to contact your insurance about covered benefits for this test. Your out of pocket expense could be anywhere from $0.00 to $649.00.   When you call to check coverage with your insurer, please provide the following information:   -The ONLY provider of Cologuard is Optician, dispensing  - CPT code for Cologuard is [phone].  [facility] NPI # [id number]  -[employer] Tax ID # [id number]   We have already sent your demographic and insurance information to [facility] (phone number [phone]) and they should contact you within the next week regarding your test. If you have not heard from them within the next week, please call our office at [phone].   Thank you for trusting me with your gastrointestinal care!  Reginal Capra, PA-C   _______________________________________________________  If your blood pressure at your visit was 140/90 or greater, please contact your primary care physician to follow up on this.  _______________________________________________________  If you are age 36 or older, your body mass index should be between 23-30. Your Body mass index is 26.63 kg/m. If this is out of the aforementioned range listed, please consider follow up with your Primary Care Provider.  If you are age 61 or younger, your body mass index should be between 19-25. Your Body mass index is 26.63 kg/m. If this is out of the aformentioned range listed, please consider follow up with your Primary Care Provider.   ________________________________________________________  The [facility] providers would like to encourage you to use MYCHART to communicate with providers for non-urgent requests or questions.  Due to long hold times on  the telephone, sending your provider a message by [facility] may be a faster and more efficient way to get a response.  Please allow 48 business hours for a response.  Please remember that this is for non-urgent requests.  _______________________________________________________

## 2024-02-28 ENCOUNTER — Ambulatory Visit (INDEPENDENT_AMBULATORY_CARE_PROVIDER_SITE_OTHER): Payer: Medicare Other | Admitting: Family Medicine

## 2024-02-28 ENCOUNTER — Encounter: Payer: Self-pay | Admitting: Family Medicine

## 2024-02-28 VITALS — BP 122/70 | HR 63 | Temp 97.3°F | Ht 65.0 in | Wt 161.0 lb

## 2024-02-28 DIAGNOSIS — M810 Age-related osteoporosis without current pathological fracture: Secondary | ICD-10-CM | POA: Diagnosis not present

## 2024-02-28 DIAGNOSIS — I700 Atherosclerosis of aorta: Secondary | ICD-10-CM | POA: Diagnosis not present

## 2024-02-28 DIAGNOSIS — E559 Vitamin D deficiency, unspecified: Secondary | ICD-10-CM | POA: Diagnosis not present

## 2024-02-28 DIAGNOSIS — E785 Hyperlipidemia, unspecified: Secondary | ICD-10-CM | POA: Diagnosis not present

## 2024-02-28 MED ORDER — LORAZEPAM 0.5 MG PO TABS: 0.5000 mg | ORAL_TABLET | Freq: Two times a day (BID) | ORAL | 0 refills | Status: AC | PRN

## 2024-02-28 NOTE — Patient Instructions (Addendum)
 Labs next visit- come fasting  Glad you are doing so well  Recommended follow up: Return for next already scheduled visit or sooner if needed.

## 2024-02-28 NOTE — Progress Notes (Signed)
 Phone [phone] In person visit   Subjective:   Taylor Bailey is a 78 y.o. year old very pleasant female patient who presents for/with See problem oriented charting Chief Complaint  Patient presents with   Medical Management of Chronic Issues   Hyperlipidemia   Past Medical History-  Patient Active Problem List   Diagnosis Date Noted   Carcinoid tumor of left lung 09/08/2020    Priority: High   Aortic atherosclerosis (HCC) 08/30/2021    Priority: Medium    Hyperlipidemia, unspecified 04/02/2019    Priority: Medium    Osteoporosis 10/2017    Priority: Medium    S/P lobectomy of lung 10/01/2020    Priority: Low   Vitamin D  deficiency     Priority: Low    Medications- reviewed and updated Current Outpatient Medications  Medication Sig Dispense Refill   Cholecalciferol (VITAMIN D ) 50 MCG (2000 UT) CAPS Take 2,000 Units by mouth daily. Gummie     LORazepam  (ATIVAN ) 0.5 MG tablet Take 1 tablet (0.5 mg total) by mouth 2 (two) times daily as needed for anxiety (do not drive for 8 hours after taking or drink alcohol within 8 hours of use). 20 tablet 1   No current facility-administered medications for this visit.     Objective:  BP 122/70   Pulse 63   Temp (!) 97.3 F (36.3 C)   Ht 5' 5 (1.651 m)   Wt 161 lb (73 kg)   SpO2 97%   BMI 26.79 kg/m  Gen: NAD, resting comfortably CV: RRR no murmurs rubs or gallops Lungs: CTAB no crackles, wheeze, rhonchi Abdomen: soft/nontender/nondistended/normal bowel sounds. No rebound or guarding.  Ext: trace edema Skin: warm, dry     Assessment and Plan   #social update- sister with severe fall onto face with nasal fracture and post concussive syndrome- improving but still challenging with memory changes. Also with BENIGN PAROXYSMAL POSITIONAL VERTIGO (BPPV) on top of that.  -still enjoying reading regular! 4 books in last week  #plantar fasciitis- saw podiatry about 2 months ago thankfully improving and able to walk  again- ice packs, stretching, heat and good supportive shoes  # Health maintenance -Prior severe flu shot reaction -Due to reaction to flu shot prefers to hold off on Prevnar, COVID, RSV, Shingrix vaccination and avoid going out or wearing KN95 -She has had Tdap within 10 years- 2019 but we don't have copy -Receives yearly mammograms  -Follows with [facility] now-past age based screening requirements for cervical cancer -Colonoscopy 11/22/2013 with 10-year repeat recommended - cologuard ordered 02/26/24 -sees dermatology specialists - had squamous cell removed  # Anxiety/panic attacks S: medication(s): very sparing lorazepam  has been helpful- 20 lasted nearly 6 months in 2024 into 2025   Prior history:  -Worsening  around Thanksgiving 2024 into December -winter months still worse -had anxiety attacks years and years ago around age 57 or 20 - was treated with medication in the past but eventually able to come off medications by using meditation -meditation also still helpful  A/P: doing better even just having lorazepam  on hand- continue current medications but focus mainly on her walking and yoga  -can refill ativan  if needed but did refill today and hoping 20-40 lasts 6 months  # History of carcinoid lung cancer status with history of left upper lobectomy S: Patient has done well overall - Has had lingering neuropathic pain left upper quadrant with triggers including prolonged sitting - Thankfully still able to walk 20 to 25 miles  a week prior to plantar fasciitis- now working back up to 15 -most recent scan 11/06/2023- spot on opposite side watching and stable A/P: thankfully keeps close follow up and doing very well   #hyperlipidemia # Aortic atherosclerosis but no coronary artery calcium noted on chest CT S: Medication:None -She is aware that aortic atherosclerosis is an independent risk factor cardiovascular disease Lab Results  Component Value Date   CHOL 224 (H)  09/08/2022   HDL 77.20 09/08/2022   LDLCALC 130 (H) 09/08/2022   TRIG 82.0 09/08/2022   CHOLHDL 3 09/08/2022   A/P: lipids above ideal goal in past for aortic atherosclerosis but thankfully no coronary artery calcium mentioned on prior CT -feels could tweak diet but wants to enjoy certain foods   # GERD-Pepcid  as needed over-the-counter- mainly controls with diet- even did burger other day and did well   # Osteoporosis S: Last DEXA: 04/12/2022 with Taylor Bailey (now gone)-stable to improved so opted out of medications   Medication (bisphosphonate or prolia): None  Calcium: 1200mg  (through diet ok) recommended -yes Vitamin D : 1000 units a day recommended-yes A/P: she is aware of potential fracture risk but she wants to remain off medicine unless has fracture -prefers to minimize - keep up the walking  #Vitamin D  deficiency S: Medication: at least 2000 units A/P: hopefully stable- update vitamin D  next labs. Continue current meds for now    # Thyroid  nodule-noted on chest CT and follow-up thyroid  ultrasound on 11/11/2022 showed 1.5 cm right sided cystic and left-sided spongiform nodules that do not meet criteria for biopsy or follow-up    Recommended follow up: Return for next already scheduled visit or sooner if needed. Future Appointments  Date Time Provider Department Center  08/29/2024 10:00 AM Taylor Jaeger, MD [facility]  11/13/2024  9:00 AM [facility] None  11/20/2024  9:30 AM Taylor Simas, MD [facility] None    Lab/Order associations: wants to hold off on labs today- CBC, CMP, lipid, a1c, and vitmain D last visit    ICD-10-CM   1. Hyperlipidemia, unspecified hyperlipidemia type  E78.5     2. Age-related osteoporosis without current pathological fracture  M81.0     3. Aortic atherosclerosis (HCC)  I70.0     4. Vitamin D  deficiency  E55.9       No orders of the defined types were placed in this encounter.   Return precautions advised.  Taylor Crooked, MD

## 2024-03-08 LAB — COLOGUARD: COLOGUARD: NEGATIVE

## 2024-03-11 ENCOUNTER — Ambulatory Visit: Payer: Self-pay | Admitting: Physician Assistant

## 2024-04-15 ENCOUNTER — Ambulatory Visit: Payer: Self-pay

## 2024-04-15 NOTE — Telephone Encounter (Signed)
 Appt tomorrow 8/5.

## 2024-04-15 NOTE — Telephone Encounter (Signed)
 FYI Only or Action Required?: FYI only for provider.  Patient was last seen in primary care on 02/28/2024 by Katrinka Garnette KIDD, MD.  Called Nurse Triage reporting Dizziness.  Symptoms began several days ago.  Interventions attempted: Rest, hydration, or home remedies.  Symptoms are: unchanged.  Triage Disposition: See Physician Within 24 Hours  Patient/caregiver understands and will follow disposition?:   Copied from CRM #[id number]. Topic: Clinical - Red Word Triage >> Apr 15, 2024 10:08 AM [place] J wrote: [facility] that prompted transfer to Nurse Triage: Blood pressure is reading at 121/63 this morning and continues to fluctuate. She has been experiencing dizziness as well but it's only occasional. Reason for Disposition  Taking a medicine that could cause dizziness (e.g., blood pressure medications, diuretics)  Answer Assessment - Initial Assessment Questions 1. DESCRIPTION: Describe your dizziness.     Slight lightheaded 2. LIGHTHEADED: Do you feel lightheaded? (e.g., somewhat faint, woozy, weak upon standing)     Somewhat faint 3. VERTIGO: Do you feel like either you or the room is spinning or tilting? (i.e., vertigo)     Denies 4. SEVERITY: How bad is it?  Do you feel like you are going to faint? Can you stand and walk?     Mild 5. ONSET:  When did the dizziness begin?     July 21 6. AGGRAVATING FACTORS: Does anything make it worse? (e.g., standing, change in head position)     None 7. HEART RATE: Can you tell me your heart rate? How many beats in 15 seconds?  (Note: Not all patients can do this.)       Normal 8. CAUSE: What do you think is causing the dizziness? (e.g., decreased fluids or food, diarrhea, emotional distress, heat exposure, new medicine, sudden standing, vomiting; unknown)     Unsure, blood pressure  9. RECURRENT SYMPTOM: Have you had dizziness before? If Yes, ask: When was the last time? What happened that time?     yes 10. OTHER  SYMPTOMS: Do you have any other symptoms? (e.g., fever, chest pain, vomiting, diarrhea, bleeding)       Anxiety  Additional info:  New to daily blood pressure checks.   Baseline is 140/70. Yesterday 139/72. This morning 121/63  History of hypoglycemia that started when she was in her teens. Unsure if this is what is happening, but notes that eating does improve her symptoms. She is requesting lab work.  Protocols used: Dizziness - Lightheadedness-A-AH

## 2024-04-16 ENCOUNTER — Ambulatory Visit (INDEPENDENT_AMBULATORY_CARE_PROVIDER_SITE_OTHER): Admitting: Family Medicine

## 2024-04-16 ENCOUNTER — Encounter: Payer: Self-pay | Admitting: Family Medicine

## 2024-04-16 VITALS — BP 134/82 | HR 69 | Temp 97.7°F | Ht 65.0 in | Wt 161.2 lb

## 2024-04-16 DIAGNOSIS — E785 Hyperlipidemia, unspecified: Secondary | ICD-10-CM

## 2024-04-16 DIAGNOSIS — R42 Dizziness and giddiness: Secondary | ICD-10-CM | POA: Diagnosis not present

## 2024-04-16 DIAGNOSIS — E559 Vitamin D deficiency, unspecified: Secondary | ICD-10-CM | POA: Diagnosis not present

## 2024-04-16 DIAGNOSIS — Z131 Encounter for screening for diabetes mellitus: Secondary | ICD-10-CM | POA: Diagnosis not present

## 2024-04-16 DIAGNOSIS — R739 Hyperglycemia, unspecified: Secondary | ICD-10-CM | POA: Diagnosis not present

## 2024-04-16 NOTE — Patient Instructions (Addendum)
 Try glucometer or continuous monitoring to make sure sugar is not low  Over-the-counter CGM options  https://www.hellolingo.com/ This is an over-the-counter option if you have an iPhone  https://www.stelo.com/en-us /buy-stelo-one-time This is an option for android or iPhone  Please stop by lab before you go If you have mychart- we will send your results within 3 business days of us  receiving them.  If you do not have mychart- we will call you about results within 5 business days of us  receiving them.  *please also note that you will see labs on mychart as soon as they post. I will later go in and write notes on them- will say notes from Dr. Katrinka Atlas consider heart monitor if symptoms continue with increasing fluids 16 oz a day and liberalizing salt   Recommended follow up: Return for as needed for new, worsening, persistent symptoms.

## 2024-04-16 NOTE — Progress Notes (Signed)
 Phone [phone] In person visit   Subjective:   Taylor Bailey is a 78 y.o. year old very pleasant female patient who presents for/with See problem oriented charting Chief Complaint  Patient presents with   ocassional dizziness    Pt c/o occasional dizziness that is like vertigo that started back in June.    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 Gummie     LORazepam  (ATIVAN ) 0.5 MG tablet Take 1 tablet (0.5 mg total) by mouth 2 (two) times daily as needed for anxiety (do not drive for 8 hours after taking or drink alcohol within 8 hours of use). 20 tablet 0   No current facility-administered medications for this visit.     Objective:  BP 134/82   Pulse 69   Temp 97.7 F (36.5 C)   Ht 5' 5 (1.651 m)   Wt 161 lb 3.2 oz (73.1 kg)   SpO2 97%   BMI 26.83 kg/m  Gen: NAD, resting comfortably CV: RRR no murmurs rubs or gallops Lungs: CTAB no crackles, wheeze, rhonchi Ext: trace edema Skin: warm, dry     Assessment and Plan   #social update- kids with some stress between them, sister not doing well- a lot of stress  # Dizziness  # Vertigo  S:Symptoms started on July 21st- woke up in middle of night with room spinning/vertigo and another 3 days later without recurrence. No hearing loss or tinnitus.   - since June likely- When she lays down for a while or sits and then gets up feels lightheaded -still push mowing yard  which is a quarter of acre- no issues on those days but does try to increase salt - symptoms more common on days less aactive -also with walking no issues  History of lower blood sugar froom teenage years until 2005. Notes with dizzy episodes that she does get better when she eats. Keeps soda, pb + J, and peanut butter and notes improvement but not resolution when she eats this. History of sugar running low in the past - has checked home blood pressure and average 130/72 -48-60 oz of water  ROS- No facial or extremity weakness. No slurred words or trouble swallowing. no blurry vision or double vision. No paresthesias. No confusion or word finding difficulties. No chest pain or shortness of breath . No hearing loss . No tinnitus . Some baseline headache(s) but not worsening-pressure related   A/P: Patient with 2 episodes of vertigo about 2 weeks ago that occurred with rolling over in bed-this certainly sounds like BPPV and with no further recurrence we opted to hold off on further evaluation  Her bigger issue is last 1 or 2 months has had more episodes of dizziness/feeling lightheaded without vertigo.  She feels like these improved with eating some food.  Never occur with exertion-usually with position change from sitting to standing or laying to  sitting.  Orthostatics negative today but we discussed this does not mean she is not orthostatic with those events.  Encouraged her to push fluids and can liberalize salt some.  She is also concerned about hypoglycemia but does not want to use a glucometer so I gave her some over-the-counter CGM options -She does have a history of dizziness/presyncope from teenage years until the early 2000's so her greatest concern is to rule out blood sugars being low and I do think that is high on the differential especially with improvement with eating  We discussed if we do not find any answers obtain blood work today, home glucose monitoring that we would pursue cardiac  monitor to be on the more cautious side  No increase in baseline headaches and with only intermittent issues I do not think we need to pursue brain MRI at this time.  Followed regularly with oncology and has been doing well including scans in February  Recommended follow up: Return for as needed for new, worsening, persistent symptoms. Future Appointments  Date Time Provider Department Center  08/29/2024 10:00 AM Katrinka Garnette KIDD, MD [facility]  11/13/2024  9:00 AM [facility] None  11/20/2024  9:30 AM Sherrod Sherrod, MD [facility] None    Lab/Order associations: labs at 4 15- lunch at 12   ICD-10-CM   1. Hyperlipidemia, unspecified hyperlipidemia type  E78.5 Comprehensive metabolic panel with GFR    CBC with Differential/Platelet    Lipid panel    TSH    2. Screening for diabetes mellitus  Z13.1 Hemoglobin A1c    3. Hyperglycemia  R73.9 Hemoglobin A1c    4. Vitamin D  deficiency  E55.9 VITAMIN D  25 Hydroxy (Vit-D Deficiency, Fractures)      No orders of the defined types were placed in this encounter.   Return precautions advised.  Garnette Katrinka, MD

## 2024-04-17 ENCOUNTER — Ambulatory Visit: Payer: Self-pay | Admitting: Family Medicine

## 2024-04-17 LAB — COMPREHENSIVE METABOLIC PANEL WITH GFR
ALT: 13 U/L (ref 0–35)
AST: 21 U/L (ref 0–37)
Albumin: 4.6 g/dL (ref 3.5–5.2)
Alkaline Phosphatase: 65 U/L (ref 39–117)
BUN: 12 mg/dL (ref 6–23)
CO2: 29 meq/L (ref 19–32)
Calcium: 10 mg/dL (ref 8.4–10.5)
Chloride: 102 meq/L (ref 96–112)
Creatinine, Ser: 0.68 mg/dL (ref 0.40–1.20)
GFR: 84.03 mL/min (ref >60.00)
Glucose, Bld: 85 mg/dL (ref 70–99)
Potassium: 4.3 meq/L (ref 3.5–5.1)
Sodium: 139 meq/L (ref 135–145)
Total Bilirubin: 0.4 mg/dL (ref 0.2–1.2)
Total Protein: 7.7 g/dL (ref 6.0–8.3)

## 2024-04-17 LAB — CBC WITH DIFFERENTIAL/PLATELET
Basophils Absolute: 0 K/uL (ref 0.0–0.1)
Basophils Relative: 0.5 % (ref 0.0–3.0)
Eosinophils Absolute: 0.1 K/uL (ref 0.0–0.7)
Eosinophils Relative: 2.2 % (ref 0.0–5.0)
HCT: 41.4 % (ref 36.0–46.0)
Hemoglobin: 13.7 g/dL (ref 12.0–15.0)
Lymphocytes Relative: 40.1 % (ref 12.0–46.0)
Lymphs Abs: 2 K/uL (ref 0.7–4.0)
MCHC: 33 g/dL (ref 30.0–36.0)
MCV: 85.2 fl (ref 78.0–100.0)
Monocytes Absolute: 0.6 K/uL (ref 0.1–1.0)
Monocytes Relative: 11.4 % (ref 3.0–12.0)
Neutro Abs: 2.3 K/uL (ref 1.4–7.7)
Neutrophils Relative %: 45.8 % (ref 43.0–77.0)
Platelets: 250 K/uL (ref 150.0–400.0)
RBC: 4.87 Mil/uL (ref 3.87–5.11)
RDW: 14.5 % (ref 11.5–15.5)
WBC: 4.9 K/uL (ref 4.0–10.5)

## 2024-04-17 LAB — LIPID PANEL
Cholesterol: 254 mg/dL — ABNORMAL HIGH (ref 0–200)
HDL: 73.5 mg/dL (ref >39.00)
LDL Cholesterol: 158 mg/dL — ABNORMAL HIGH (ref 0–99)
NonHDL: 180.39
Total CHOL/HDL Ratio: 3
Triglycerides: 111 mg/dL (ref 0.0–149.0)
VLDL: 22.2 mg/dL (ref 0.0–40.0)

## 2024-04-17 LAB — TSH: TSH: 2.21 u[IU]/mL (ref 0.35–5.50)

## 2024-04-17 LAB — VITAMIN D 25 HYDROXY (VIT D DEFICIENCY, FRACTURES): VITD: 69.07 ng/mL (ref 30.00–100.00)

## 2024-04-17 LAB — HEMOGLOBIN A1C: Hgb A1c MFr Bld: 6.2 % (ref 4.6–6.5)

## 2024-08-29 ENCOUNTER — Ambulatory Visit: Payer: Medicare Other | Admitting: Family Medicine

## 2024-08-29 ENCOUNTER — Encounter: Payer: Self-pay | Admitting: Family Medicine

## 2024-08-29 VITALS — BP 128/82 | HR 78 | Temp 97.7°F | Ht 65.0 in | Wt 163.0 lb

## 2024-08-29 DIAGNOSIS — D3A090 Benign carcinoid tumor of the bronchus and lung: Secondary | ICD-10-CM

## 2024-08-29 DIAGNOSIS — Z85110 Personal history of malignant carcinoid tumor of bronchus and lung: Secondary | ICD-10-CM | POA: Diagnosis not present

## 2024-08-29 DIAGNOSIS — F410 Panic disorder [episodic paroxysmal anxiety] without agoraphobia: Secondary | ICD-10-CM | POA: Insufficient documentation

## 2024-08-29 DIAGNOSIS — E785 Hyperlipidemia, unspecified: Secondary | ICD-10-CM

## 2024-08-29 DIAGNOSIS — M810 Age-related osteoporosis without current pathological fracture: Secondary | ICD-10-CM | POA: Diagnosis not present

## 2024-08-29 NOTE — Progress Notes (Signed)
 Phone [phone] In person visit   Subjective:   Taylor Bailey is a 78 y.o. 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 Gummie     LORazepam  (ATIVAN ) 0.5 MG tablet Take 1 tablet (0.5 mg total) by mouth 2 (two) times daily as needed for anxiety (do not drive for 8 hours after taking or drink alcohol within 8 hours of use). 20 tablet 0   No current facility-administered medications for this visit.     Objective:  BP 128/82 (BP Location: Left Arm, Patient Position: Sitting, Cuff Size: Normal)   Pulse 78   Temp 97.7 F (36.5 C) (Temporal)   Ht 5' 5 (1.651 m)   Wt 163 lb (73.9 kg)   SpO2 98%   BMI 27.12 kg/m  Gen: NAD, resting comfortably CV: RRR no murmurs rubs or gallops Lungs: CTAB no crackles, wheeze, rhonchi Abdomen: soft/nontender/nondistended/normal bowel sounds. No rebound or guarding.  Ext: no edema Skin: warm, dry Neuro: grossly normal, moves all extremities     Assessment and Plan   # Health  maintenance -still walking 15-20 miles a week!  -Prior severe flu shot reaction -Due to reaction to flu shot prefers to hold off on Prevnar, COVID, RSV, Shingrix vaccination  -She has had Tdap within 10 years-trying to get us  the date- she may simply update  -Receives yearly mammograms most recently 01/23/2024 -Follows with [facility] now-past age based screening requirements for cervical cancer -Colonoscopy 11/22/2013 with 10-year repeat recommended.  cologuard ordered 02/26/24 and negative -sees dermatology specialists as well for screening- procedure in January planned for squamous cell carcinoma repeat excision -reports prior dizziness better- wonders if improved diet helped  # Pulled muscle concern S: Patient with viral infection in September that lasted about 3 weeks with significant amount of coughing- treated with cefdinir with urgent care.  She feels like she pulled a muscle in upper chest- continues to bother her. 2/10 discomfort with movement  A/P: pain on exam- wonder about costochondriits. has not tried anything yet for this - mentioned heating pad potentially- is getting some positions with ipad that could be irritating- could try to alter positions  -she will be having CT in about 2 months and we opted out of x-ray today   # painful knot on the back of her head about pea sized  S: noted between October and November - pea sized area- somewhat  tender subcutaneous. Only bothers with direct contact- brushing har doesn't bother it or pulling hair up  A/P: I wonder if this  could be a neurofibroma but stable- she's going of the monitor. Could also ask dermatology and she plans to run by   # History of carcinoid lung cancer status with history of left upper lobectomy S: Patient has done well overall - Has had lingering neuropathic pain left upper quadrant with triggers including prolonged sitting -most recent scan2/24/2025 A/P: upcoming scan in 10 weeks- has done very well.  Still walking. Stable weight. Congratulated her on her efforts  # Anxiety/panic attacks S: medication(s): very sparing lorazepam  has been helpful- 20 lasts over 6 months typically - has about 10 left   Prior history:  -Worsening  around Thanksgiving 2024 into December -winter months still worse -had anxiety attacks years and years ago around age 75 or 60 - was treated with medication in the past but eventually able to come off medications by using meditation -meditation also still helpful  A/P: doing well right now- has some available still of the lorazepam  but she can call and we will refill for her   #hyperlipidemia # Aortic atherosclerosis but no coronary artery calcium noted on chest CT S: Medication:None -She is aware that aortic atherosclerosis is an independent risk factor cardiovascular disease Lab Results  Component Value Date   CHOL 254 (H) 04/16/2024   HDL 73.50 04/16/2024   LDLCALC 158 (H) 04/16/2024   TRIG 111.0 04/16/2024   CHOLHDL 3 04/16/2024   A/P: lipids remain moderately elevated but with no calcium on heart shed prefer to remain off medicine - she reports trying to eat somewhat cleaner- recheck later date- not yet due   # GERD-Pepcid  as needed over-the-counter- interestingly peppermint helps her- atypical   # Osteoporosis S: Last DEXA: 04/12/2022 with Dr. Lavoie (now gone)-stable to improved so opted out of medications   Medication (bisphosphonate or prolia): None  Calcium: 1200mg  (through diet ok) recommended -yes Vitamin D : 1000 units a day recommended-yes A/P: offered bone density- she's still doing calcium and vitamin D  and weight bearing exercise- she would liek to discuss again next visit and hold for now  . She's opted out of medicine so far  #Vitamin D  deficiency S: Medication: 2000 units gummies Last vitamin D  Lab Results  Component Value Date   VD25OH 69.07 04/16/2024  A/P: well controlled continue current medications    Recommended follow up:  Return in about 6 months (around 02/27/2025) for followup or sooner if needed.Schedule b4 you leave. Future Appointments  Date Time Provider Department Center  11/13/2024  9:00 AM [facility] None  11/20/2024  9:30 AM Sherrod Sherrod, MD [facility] None    Lab/Order associations: NOT fasting   ICD-10-CM   1. Carcinoid tumor of left lung (HCC)  D3A.090     2. Age-related osteoporosis without current pathological fracture  M81.0     3. Hyperlipidemia, unspecified hyperlipidemia type  E78.5     4. Panic attacks  F41.0       No orders of the defined types were placed in this encounter.   Return precautions advised.  Garnette Lukes, MD

## 2024-08-29 NOTE — Patient Instructions (Addendum)
 Consider labs next visit- things looked really good in august  Recommended follow up: Return in about 6 months (around 02/27/2025) for followup or sooner if needed.Schedule b4 you leave.

## 2024-11-12 ENCOUNTER — Inpatient Hospital Stay

## 2024-11-12 ENCOUNTER — Other Ambulatory Visit (HOSPITAL_COMMUNITY)

## 2024-11-13 ENCOUNTER — Other Ambulatory Visit

## 2024-11-20 ENCOUNTER — Ambulatory Visit: Admitting: Internal Medicine

## 2025-02-27 ENCOUNTER — Ambulatory Visit: Admitting: Family Medicine
